# Patient Record
Sex: Male | Born: 1975 | Race: Black or African American | Hispanic: No | Marital: Single | Smoking: Former smoker
Health system: Southern US, Community
[De-identification: ages and names within clinical notes are randomized; demographics above are authoritative.]

## PROBLEM LIST (undated history)

## (undated) DIAGNOSIS — I10 Essential (primary) hypertension: Secondary | ICD-10-CM

## (undated) DIAGNOSIS — I82409 Acute embolism and thrombosis of unspecified deep veins of unspecified lower extremity: Secondary | ICD-10-CM

## (undated) DIAGNOSIS — E119 Type 2 diabetes mellitus without complications: Secondary | ICD-10-CM

## (undated) DIAGNOSIS — N200 Calculus of kidney: Secondary | ICD-10-CM

---

## 2004-02-15 ENCOUNTER — Emergency Department: Payer: Self-pay | Admitting: Emergency Medicine

## 2005-12-28 ENCOUNTER — Emergency Department (HOSPITAL_COMMUNITY): Admission: EM | Admit: 2005-12-28 | Discharge: 2005-12-28 | Payer: Self-pay | Admitting: Emergency Medicine

## 2008-08-09 ENCOUNTER — Ambulatory Visit: Payer: Self-pay | Admitting: Family Medicine

## 2009-08-28 ENCOUNTER — Emergency Department: Payer: Self-pay | Admitting: Emergency Medicine

## 2012-09-30 ENCOUNTER — Emergency Department (HOSPITAL_COMMUNITY)
Admission: EM | Admit: 2012-09-30 | Discharge: 2012-09-30 | Disposition: A | Discharge status: Home / Self Care | Payer: BC Managed Care – PPO | Source: Home / Self Care | Attending: Emergency Medicine | Admitting: Emergency Medicine

## 2012-09-30 ENCOUNTER — Encounter (HOSPITAL_COMMUNITY): Payer: Self-pay | Admitting: Emergency Medicine

## 2012-09-30 DIAGNOSIS — N200 Calculus of kidney: Secondary | ICD-10-CM

## 2012-09-30 DIAGNOSIS — R319 Hematuria, unspecified: Secondary | ICD-10-CM

## 2012-09-30 HISTORY — DX: Type 2 diabetes mellitus without complications: E11.9

## 2012-09-30 HISTORY — DX: Calculus of kidney: N20.0

## 2012-09-30 HISTORY — DX: Essential (primary) hypertension: I10

## 2012-09-30 LAB — POCT URINALYSIS DIP (DEVICE)
Bilirubin Urine: NEGATIVE
Leukocytes, UA: NEGATIVE
Nitrite: NEGATIVE
pH: 5.5 (ref 5.0–8.0)

## 2012-09-30 MED ORDER — METFORMIN HCL 1000 MG PO TABS: 500.0000 mg | ORAL_TABLET | Freq: Two times a day (BID) | ORAL | Status: DC

## 2012-09-30 MED ORDER — METFORMIN HCL 1000 MG PO TABS: 1000.0000 mg | ORAL_TABLET | Freq: Two times a day (BID) | ORAL | Status: DC

## 2012-09-30 NOTE — ED Notes (Signed)
Pt c/o 1 episode of hematuria onset 1430... Reports he had several urinary voids w/no blood but having dysuria... Denies: fevers, back/abd pain... Hx of kidney stones 2 yrs ago; does not remember how they were resolved but remembers going to [facility]... He is alert and ambulated well to exam room w/no signs of acute distress.

## 2012-09-30 NOTE — ED Provider Notes (Signed)
History    CSN: [id number] Arrival date & time 09/30/12  1858  First MD Initiated Contact with Patient 09/30/12 1906     Chief Complaint  Patient presents with  . Hematuria   (Consider location/radiation/quality/duration/timing/severity/associated sxs/prior Treatment) HPI Comments:   Patient presents to urgent carethis evening describing that earlier today he had several small episodes of urinary discomfort and minimal pelvic pain that subsided after that. He did notice a small blood clot in his urine. Patient reports that he had a kidney stone about 2 years ago that passed spontaneously and was diagnosed with a CT scan. Patient also describes that he is diabetic and has been off medicines for many months now as he lost his insurance coverage.  Currently patient denies any abdominal pain flank pain nausea vomiting or fevers   Patient is a 37 y.o. male presenting with hematuria. The history is provided by the patient.  Hematuria This is a new problem. The problem occurs constantly. Pertinent negatives include no chest pain, no headaches and no shortness of breath. Nothing aggravates the symptoms. Nothing relieves the symptoms.   Past Medical History  Diagnosis Date  . Kidney stone   . Hypertension   . Diabetes mellitus without complication    History reviewed. No pertinent past surgical history. No family history on file. History  Substance Use Topics  . Smoking status: Light Tobacco Smoker  . Smokeless tobacco: Not on file  . Alcohol Use: No    Review of Systems  Constitutional: Negative for fever, chills, diaphoresis, appetite change and fatigue.  Respiratory: Negative for shortness of breath.   Cardiovascular: Negative for chest pain.  Genitourinary: Positive for hematuria. Negative for urgency, flank pain, decreased urine volume, discharge, penile swelling, scrotal swelling, penile pain and testicular pain.  Neurological: Negative for headaches.    Allergies  Review  of patient's allergies indicates no known allergies.  Home Medications   Current Outpatient Rx  Name  Route  Sig  Dispense  Refill  . LISINOPRIL PO   Oral   Take by mouth.         . METFORMIN HCL PO   Oral   Take by mouth.         . Rosuvastatin Calcium (CRESTOR PO)   Oral   Take by mouth.         . metFORMIN (GLUCOPHAGE) 1000 MG tablet   Oral   Take 0.5 tablets (500 mg total) by mouth 2 (two) times daily with a meal.   60 tablet   1    BP 144/79  Pulse 97  Temp(Src) 98.2 F (36.8 C) (Oral)  Resp 20  SpO2 98% Physical Exam  Nursing note and vitals reviewed. Constitutional: He appears well-developed and well-nourished. No distress.  Pulmonary/Chest: Effort normal and breath sounds normal.  Abdominal: Soft. He exhibits no distension and no mass. There is no tenderness. There is no rigidity, no rebound, no guarding, no CVA tenderness, no tenderness at McBurney's point and negative Murphy's sign.  Neurological: He is alert.  Skin: No erythema.    ED Course  Procedures (including critical care time) Labs Reviewed  GLUCOSE, CAPILLARY - Abnormal; Notable for the following:    Glucose-Capillary 302 (*)    All other components within normal limits  POCT URINALYSIS DIP (DEVICE) - Abnormal; Notable for the following:    Glucose, UA 500 (*)    Ketones, ur TRACE (*)    Hgb urine dipstick TRACE (*)    All other components  within normal limits   No results found. 1. Hematuria   2. Kidney stones     MDM  Problem #1 Hematuria with resolved pain,  Patient had a episode early this afternoon of frequent urination and minimal discomfort that subsided after noticing a blood clot in his urine. Patient is currently asymptomatic afebrile with a normal abdominal exam. Suspect a result ureteral lithiasis- abdomen discuss with patient what symptoms will warrant further evaluation in the emergency department. Such as increased abdominal pain vomiting or fevers. Patient is  currently asymptomatic on the exam table.  A strainer was provided to patient   Problem #2 is compensated diabetes  Marked glucosuria was noted in urine which elicited a capillary glucose was in the 300s. Patient has been advised to resume his metformin the patient described and reported to his that he has a followup appointment with her new primary care Dr. as he had to interrupt his medical attention due to lack of insurance.  Jimmie Molly, MD 09/30/12 2029

## 2014-02-26 ENCOUNTER — Emergency Department (HOSPITAL_COMMUNITY): Payer: BC Managed Care – PPO

## 2014-02-26 ENCOUNTER — Encounter (HOSPITAL_COMMUNITY): Payer: Self-pay | Admitting: Emergency Medicine

## 2014-02-26 ENCOUNTER — Emergency Department (HOSPITAL_COMMUNITY)
Admission: EM | Admit: 2014-02-26 | Discharge: 2014-02-26 | Disposition: A | Discharge status: Home / Self Care | Payer: BC Managed Care – PPO | Attending: Emergency Medicine | Admitting: Emergency Medicine

## 2014-02-26 DIAGNOSIS — J069 Acute upper respiratory infection, unspecified: Secondary | ICD-10-CM | POA: Diagnosis not present

## 2014-02-26 DIAGNOSIS — M791 Myalgia: Secondary | ICD-10-CM | POA: Insufficient documentation

## 2014-02-26 DIAGNOSIS — I10 Essential (primary) hypertension: Secondary | ICD-10-CM | POA: Diagnosis not present

## 2014-02-26 DIAGNOSIS — Z87442 Personal history of urinary calculi: Secondary | ICD-10-CM | POA: Insufficient documentation

## 2014-02-26 DIAGNOSIS — R05 Cough: Secondary | ICD-10-CM

## 2014-02-26 DIAGNOSIS — E1165 Type 2 diabetes mellitus with hyperglycemia: Secondary | ICD-10-CM | POA: Insufficient documentation

## 2014-02-26 DIAGNOSIS — B9789 Other viral agents as the cause of diseases classified elsewhere: Secondary | ICD-10-CM

## 2014-02-26 DIAGNOSIS — R059 Cough, unspecified: Secondary | ICD-10-CM

## 2014-02-26 DIAGNOSIS — Z79899 Other long term (current) drug therapy: Secondary | ICD-10-CM | POA: Insufficient documentation

## 2014-02-26 DIAGNOSIS — R630 Anorexia: Secondary | ICD-10-CM | POA: Diagnosis not present

## 2014-02-26 DIAGNOSIS — Z880 Allergy status to penicillin: Secondary | ICD-10-CM | POA: Diagnosis not present

## 2014-02-26 DIAGNOSIS — R52 Pain, unspecified: Secondary | ICD-10-CM | POA: Diagnosis present

## 2014-02-26 DIAGNOSIS — R739 Hyperglycemia, unspecified: Secondary | ICD-10-CM

## 2014-02-26 LAB — CBG MONITORING, ED: GLUCOSE-CAPILLARY: 202 mg/dL — AB (ref 70–99)

## 2014-02-26 MED ORDER — PSEUDOEPHEDRINE HCL 30 MG PO TABS: 30.0000 mg | ORAL_TABLET | ORAL | Status: DC | PRN

## 2014-02-26 MED ORDER — METFORMIN HCL 1000 MG PO TABS: 1000.0000 mg | ORAL_TABLET | Freq: Two times a day (BID) | ORAL | Status: DC

## 2014-02-26 MED ORDER — BENZONATATE 100 MG PO CAPS: 100.0000 mg | ORAL_CAPSULE | Freq: Three times a day (TID) | ORAL | Status: DC

## 2014-02-26 NOTE — ED Notes (Signed)
Patient returned from Xray. Waiting for results.

## 2014-02-26 NOTE — ED Notes (Signed)
C/o chills and body aches x3 days. Moderate cough, non-productive. Loss of appetite but denies n/v.

## 2014-02-26 NOTE — ED Notes (Signed)
Pt c/o URI sx with body aches x 3 days; pt sts some fever

## 2014-02-26 NOTE — Discharge Instructions (Signed)
Sudafed for congestion. Tessalon for cough. Take metformin on regular basis. Watch your diet, exercise. Follow up with primary care doctor for recheck.   Upper Respiratory Infection, Adult An upper respiratory infection (URI) is also sometimes known as the common cold. The upper respiratory tract includes the nose, sinuses, throat, trachea, and bronchi. Bronchi are the airways leading to the lungs. Most people improve within 1 week, but symptoms can last up to 2 weeks. A residual cough may last even longer.  CAUSES Many different viruses can infect the tissues lining the upper respiratory tract. The tissues become irritated and inflamed and often become very moist. Mucus production is also common. A cold is contagious. You can easily spread the virus to others by oral contact. This includes kissing, sharing a glass, coughing, or sneezing. Touching your mouth or nose and then touching a surface, which is then touched by another person, can also spread the virus. SYMPTOMS  Symptoms typically develop 1 to 3 days after you come in contact with a cold virus. Symptoms vary from person to person. They may include:  Runny nose.  Sneezing.  Nasal congestion.  Sinus irritation.  Sore throat.  Loss of voice (laryngitis).  Cough.  Fatigue.  Muscle aches.  Loss of appetite.  Headache.  Low-grade fever. DIAGNOSIS  You might diagnose your own cold based on familiar symptoms, since most people get a cold 2 to 3 times a year. Your caregiver can confirm this based on your exam. Most importantly, your caregiver can check that your symptoms are not due to another disease such as strep throat, sinusitis, pneumonia, asthma, or epiglottitis. Blood tests, throat tests, and X-rays are not necessary to diagnose a common cold, but they may sometimes be helpful in excluding other more serious diseases. Your caregiver will decide if any further tests are required. RISKS AND COMPLICATIONS  You may be at risk  for a more severe case of the common cold if you smoke cigarettes, have chronic heart disease (such as heart failure) or lung disease (such as asthma), or if you have a weakened immune system. The very young and very old are also at risk for more serious infections. Bacterial sinusitis, middle ear infections, and bacterial pneumonia can complicate the common cold. The common cold can worsen asthma and chronic obstructive pulmonary disease (COPD). Sometimes, these complications can require emergency medical care and may be life-threatening. PREVENTION  The best way to protect against getting a cold is to practice good hygiene. Avoid oral or hand contact with people with cold symptoms. Wash your hands often if contact occurs. There is no clear evidence that vitamin C, vitamin E, echinacea, or exercise reduces the chance of developing a cold. However, it is always recommended to get plenty of rest and practice good nutrition. TREATMENT  Treatment is directed at relieving symptoms. There is no cure. Antibiotics are not effective, because the infection is caused by a virus, not by bacteria. Treatment may include:  Increased fluid intake. Sports drinks offer valuable electrolytes, sugars, and fluids.  Breathing heated mist or steam (vaporizer or shower).  Eating chicken soup or other clear broths, and maintaining good nutrition.  Getting plenty of rest.  Using gargles or lozenges for comfort.  Controlling fevers with ibuprofen or acetaminophen as directed by your caregiver.  Increasing usage of your inhaler if you have asthma. Zinc gel and zinc lozenges, taken in the first 24 hours of the common cold, can shorten the duration and lessen the severity of symptoms. Pain  medicines may help with fever, muscle aches, and throat pain. A variety of non-prescription medicines are available to treat congestion and runny nose. Your caregiver can make recommendations and may suggest nasal or lung inhalers for other  symptoms.  HOME CARE INSTRUCTIONS   Only take over-the-counter or prescription medicines for pain, discomfort, or fever as directed by your caregiver.  Use a warm mist humidifier or inhale steam from a shower to increase air moisture. This may keep secretions moist and make it easier to breathe.  Drink enough water and fluids to keep your urine clear or pale yellow.  Rest as needed.  Return to work when your temperature has returned to normal or as your caregiver advises. You may need to stay home longer to avoid infecting others. You can also use a face mask and careful hand washing to prevent spread of the virus. SEEK MEDICAL CARE IF:   After the first few days, you feel you are getting worse rather than better.  You need your caregiver's advice about medicines to control symptoms.  You develop chills, worsening shortness of breath, or brown or red sputum. These may be signs of pneumonia.  You develop yellow or brown nasal discharge or pain in the face, especially when you bend forward. These may be signs of sinusitis.  You develop a fever, swollen neck glands, pain with swallowing, or white areas in the back of your throat. These may be signs of strep throat. SEEK IMMEDIATE MEDICAL CARE IF:   You have a fever.  You develop severe or persistent headache, ear pain, sinus pain, or chest pain.  You develop wheezing, a prolonged cough, cough up blood, or have a change in your usual mucus (if you have chronic lung disease).  You develop sore muscles or a stiff neck. Document Released: 09/01/2000 Document Revised: 05/31/2011 Document Reviewed: 06/13/2013 [facility]ExitCare Patient Information 2015 [place], [place]. This information is not intended to replace advice given to you by your health care provider. Make sure you discuss any questions you have with your health care provider.   Hyperglycemia Hyperglycemia occurs when the glucose (sugar) in your blood is too high. Hyperglycemia can happen  for many reasons, but it most often happens to people who do not know they have diabetes or are not managing their diabetes properly.  CAUSES  Whether you have diabetes or not, there are other causes of hyperglycemia. Hyperglycemia can occur when you have diabetes, but it can also occur in other situations that you might not be as aware of, such as: Diabetes  If you have diabetes and are having problems controlling your blood glucose, hyperglycemia could occur because of some of the following reasons:  Not following your meal plan.  Not taking your diabetes medications or not taking it properly.  Exercising less or doing less activity than you normally do.  Being sick. Pre-diabetes  This cannot be ignored. Before people develop Type 2 diabetes, they almost always have "pre-diabetes." This is when your blood glucose levels are higher than normal, but not yet high enough to be diagnosed as diabetes. Research has shown that some long-term damage to the body, especially the heart and circulatory system, may already be occurring during pre-diabetes. If you take action to manage your blood glucose when you have pre-diabetes, you may delay or prevent Type 2 diabetes from developing. Stress  If you have diabetes, you may be "diet" controlled or on oral medications or insulin to control your diabetes. However, you may find that  your blood glucose is higher than usual in the hospital whether you have diabetes or not. This is often referred to as "stress hyperglycemia." Stress can elevate your blood glucose. This happens because of hormones put out by the body during times of stress. If stress has been the cause of your high blood glucose, it can be followed regularly by your caregiver. That way he/she can make sure your hyperglycemia does not continue to get worse or progress to diabetes. Steroids  Steroids are medications that act on the infection fighting system (immune system) to block inflammation or  infection. One side effect can be a rise in blood glucose. Most people can produce enough extra insulin to allow for this rise, but for those who cannot, steroids make blood glucose levels go even higher. It is not unusual for steroid treatments to "uncover" diabetes that is developing. It is not always possible to determine if the hyperglycemia will go away after the steroids are stopped. A special blood test called an A1c is sometimes done to determine if your blood glucose was elevated before the steroids were started. SYMPTOMS  Thirsty.  Frequent urination.  Dry mouth.  Blurred vision.  Tired or fatigue.  Weakness.  Sleepy.  Tingling in feet or leg. DIAGNOSIS  Diagnosis is made by monitoring blood glucose in one or all of the following ways:  A1c test. This is a chemical found in your blood.  Fingerstick blood glucose monitoring.  Laboratory results. TREATMENT  First, knowing the cause of the hyperglycemia is important before the hyperglycemia can be treated. Treatment may include, but is not be limited to:  Education.  Change or adjustment in medications.  Change or adjustment in meal plan.  Treatment for an illness, infection, etc.  More frequent blood glucose monitoring.  Change in exercise plan.  Decreasing or stopping steroids.  Lifestyle changes. HOME CARE INSTRUCTIONS   Test your blood glucose as directed.  Exercise regularly. Your caregiver will give you instructions about exercise. Pre-diabetes or diabetes which comes on with stress is helped by exercising.  Eat wholesome, balanced meals. Eat often and at regular, fixed times. Your caregiver or nutritionist will give you a meal plan to guide your sugar intake.  Being at an ideal weight is important. If needed, losing as little as 10 to 15 pounds may help improve blood glucose levels. SEEK MEDICAL CARE IF:   You have questions about medicine, activity, or diet.  You continue to have symptoms  (problems such as increased thirst, urination, or weight gain). SEEK IMMEDIATE MEDICAL CARE IF:   You are vomiting or have diarrhea.  Your breath smells fruity.  You are breathing faster or slower.  You are very sleepy or incoherent.  You have numbness, tingling, or pain in your feet or hands.  You have chest pain.  Your symptoms get worse even though you have been following your caregiver's orders.  If you have any other questions or concerns. Document Released: 09/01/2000 Document Revised: 05/31/2011 Document Reviewed: 07/05/2011 [facility] CenterExitCare Patient Information 2015 [place]ExitCare, [place]LLC. This information is not intended to replace advice given to you by your health care provider. Make sure you discuss any questions you have with your health care provider.

## 2014-02-26 NOTE — ED Provider Notes (Signed)
CSN: [id number]     Arrival date & time 02/26/14  1229 History  This chart was scribed for non-physician practitioner, Jaynie Crumbleatyana Eero Dini, PA-C working with Arby BarretteMarcy Pfeiffer, MD by Greggory StallionKayla Andersen, ED scribe. This patient was seen in room TR06C/TR06C and the patient's care was started at 1:17 PM.   Chief Complaint  Patient presents with  . URI  . Generalized Body Aches   The history is provided by the patient. No language interpreter was used.    HPI Comments: Noah Clayton is a 38 y.o. male who presents to the Emergency Department complaining of nonproductive cough, chills and generalized body aches that started one week ago. Also reports decreased appetite and subjective fever. Pt has taken coricidin flu. Symptoms went away for 2 days then returned. Denies sore throat. Pt states he can't afford the strips to check his sugar so he hasn't recently. They were running around 140 the last time he checked them. He hasn't taken his metformin since earlier this year. Pt does not have a PCP.   Past Medical History  Diagnosis Date  . Kidney stone   . Hypertension   . Diabetes mellitus without complication    History reviewed. No pertinent past surgical history. History reviewed. No pertinent family history. History  Substance Use Topics  . Smoking status: Light Tobacco Smoker  . Smokeless tobacco: Not on file  . Alcohol Use: No    Review of Systems  Constitutional: Positive for fever, chills and appetite change.  HENT: Negative for sore throat.   Respiratory: Positive for cough.   Musculoskeletal: Positive for myalgias.  All other systems reviewed and are negative.  Allergies  Amoxicillin  Home Medications   Prior to Admission medications   Medication Sig Start Date End Date Taking? Authorizing Provider  Benazepril HCl (LOTENSIN PO) Take 2 tablets by mouth at bedtime.   Yes Historical Provider, MD  Chlorphen-Pseudoephed-APAP (CORICIDIN D PO) Take by mouth as needed (flu).   Yes  Historical Provider, MD  metFORMIN (GLUCOPHAGE) 1000 MG tablet Take 0.5 tablets (500 mg total) by mouth 2 (two) times daily with a meal. 09/30/12  Yes Jimmie MollyPaolo Coll, MD  Rosuvastatin Calcium (CRESTOR PO) Take 1 tablet by mouth daily.    Yes Historical Provider, MD   BP 140/84 mmHg  Pulse 91  Temp(Src) 98 F (36.7 C) (Oral)  Resp 20  Ht 5\' 11"  (1.803 m)  Wt 280 lb (127.007 kg)  BMI 39.07 kg/m2  SpO2 95%   Physical Exam  Constitutional: He is oriented to person, place, and time. He appears well-developed and well-nourished. No distress.  HENT:  Head: Normocephalic and atraumatic.  Right Ear: External ear and ear canal normal.  Left Ear: Tympanic membrane, external ear and ear canal normal.  Nose: Rhinorrhea present.  Mouth/Throat: Uvula is midline and mucous membranes are normal. Posterior oropharyngeal erythema present. No oropharyngeal exudate or tonsillar abscesses.  Eyes: Conjunctivae and EOM are normal.  Neck: Normal range of motion. Neck supple. No tracheal deviation present.  Cardiovascular: Normal rate, regular rhythm and normal heart sounds.   No murmur heard. Pulmonary/Chest: Effort normal. No respiratory distress. He has no wheezes. He has no rales. He exhibits no tenderness.  Musculoskeletal: Normal range of motion.  Neurological: He is alert and oriented to person, place, and time.  Skin: Skin is warm and dry.  Psychiatric: He has a normal mood and affect. His behavior is normal.  Nursing note and vitals reviewed.   ED Course  Procedures (including critical  care time)  DIAGNOSTIC STUDIES: Oxygen Saturation is 95% on RA, adequate by my interpretation.    COORDINATION OF CARE: 1:21 PM-Discussed treatment plan which includes chest xray and blood glucose check with pt at bedside and pt agreed to plan.   Labs Review Labs Reviewed  CBG MONITORING, ED - Abnormal; Notable for the following:    Glucose-Capillary 202 (*)    All other components within normal limits     Imaging Review Dg Chest 2 View  02/26/2014   CLINICAL DATA:  Cough.  EXAM: CHEST  2 VIEW  COMPARISON:  December 28, 2005.  FINDINGS: The heart size and mediastinal contours are within normal limits. Both lungs are clear. No pneumothorax or pleural effusion is noted. The visualized skeletal structures are unremarkable.  IMPRESSION: No acute cardiopulmonary abnormality seen.   Electronically Signed   By: Roque LiasJames  Green M.D.   On: 02/26/2014 14:00     EKG Interpretation None      MDM   Final diagnoses:  Hyperglycemia  Viral URI with cough    Ration is here with URI symptoms. Also unsure what his blood sugar is. He is afebrile, nontoxic appearing, normal vital signs. Blood sugars 202. We'll restart him back on his metformin, chest x-ray obtained and is negative. I suspect patient has a viral URI versus influenza. Treatment symptomatic. Discharge home with outpatient follow-up. Return precautions discussed  Filed Vitals:   02/26/14 1240 02/26/14 1434  BP: 140/84 135/80  Pulse: 91 77  Temp: 98 F (36.7 C) 97.9 F (36.6 C)  TempSrc: Oral Oral  Resp: 20 20  Height: 5\' 11"  (1.803 m)   Weight: 280 lb (127.007 kg)   SpO2: 95% 96%     I personally performed the services described in this documentation, which was scribed in my presence. The recorded information has been reviewed and is accurate.  Lottie Musselatyana A Krisi Azua, PA-C 02/26/14 1518  Arby BarretteMarcy Pfeiffer, MD 02/26/14 [phone]

## 2014-07-06 ENCOUNTER — Emergency Department (HOSPITAL_COMMUNITY)
Admission: EM | Admit: 2014-07-06 | Discharge: 2014-07-06 | Disposition: A | Discharge status: Home / Self Care | Payer: BLUE CROSS/BLUE SHIELD | Attending: Emergency Medicine | Admitting: Emergency Medicine

## 2014-07-06 ENCOUNTER — Encounter (HOSPITAL_COMMUNITY): Payer: Self-pay | Admitting: Emergency Medicine

## 2014-07-06 DIAGNOSIS — Z880 Allergy status to penicillin: Secondary | ICD-10-CM | POA: Insufficient documentation

## 2014-07-06 DIAGNOSIS — Z720 Tobacco use: Secondary | ICD-10-CM | POA: Diagnosis not present

## 2014-07-06 DIAGNOSIS — Z79899 Other long term (current) drug therapy: Secondary | ICD-10-CM | POA: Diagnosis not present

## 2014-07-06 DIAGNOSIS — I82401 Acute embolism and thrombosis of unspecified deep veins of right lower extremity: Secondary | ICD-10-CM

## 2014-07-06 DIAGNOSIS — I10 Essential (primary) hypertension: Secondary | ICD-10-CM | POA: Diagnosis not present

## 2014-07-06 DIAGNOSIS — M79604 Pain in right leg: Secondary | ICD-10-CM | POA: Diagnosis present

## 2014-07-06 DIAGNOSIS — E119 Type 2 diabetes mellitus without complications: Secondary | ICD-10-CM | POA: Diagnosis not present

## 2014-07-06 DIAGNOSIS — Z87442 Personal history of urinary calculi: Secondary | ICD-10-CM | POA: Diagnosis not present

## 2014-07-06 DIAGNOSIS — M79609 Pain in unspecified limb: Secondary | ICD-10-CM | POA: Diagnosis not present

## 2014-07-06 LAB — BASIC METABOLIC PANEL
Anion gap: 10 (ref 5–15)
BUN: 15 mg/dL (ref 6–23)
CALCIUM: 9.2 mg/dL (ref 8.4–10.5)
CO2: 24 mmol/L (ref 19–32)
Chloride: 102 mmol/L (ref 96–112)
Creatinine, Ser: 0.98 mg/dL (ref 0.50–1.35)
GFR calc Af Amer: >90 mL/min (ref >90)
Glucose, Bld: 296 mg/dL — ABNORMAL HIGH (ref 70–99)
Potassium: 4.1 mmol/L (ref 3.5–5.1)
Sodium: 136 mmol/L (ref 135–145)

## 2014-07-06 LAB — CBC WITH DIFFERENTIAL/PLATELET
BASOS ABS: 0 10*3/uL (ref 0.0–0.1)
BASOS PCT: 0 % (ref 0–1)
EOS ABS: 0.1 10*3/uL (ref 0.0–0.7)
Eosinophils Relative: 1 % (ref 0–5)
HCT: 47.8 % (ref 39.0–52.0)
HEMOGLOBIN: 16.1 g/dL (ref 13.0–17.0)
LYMPHS ABS: 3.8 10*3/uL (ref 0.7–4.0)
LYMPHS PCT: 35 % (ref 12–46)
MCH: 29.4 pg (ref 26.0–34.0)
MCHC: 33.7 g/dL (ref 30.0–36.0)
MCV: 87.2 fL (ref 78.0–100.0)
Monocytes Absolute: 0.8 10*3/uL (ref 0.1–1.0)
Monocytes Relative: 8 % (ref 3–12)
Neutro Abs: 6.1 10*3/uL (ref 1.7–7.7)
Neutrophils Relative %: 56 % (ref 43–77)
PLATELETS: 231 10*3/uL (ref 150–400)
RBC: 5.48 MIL/uL (ref 4.22–5.81)
RDW: 13.7 % (ref 11.5–15.5)
WBC: 10.8 10*3/uL — AB (ref 4.0–10.5)

## 2014-07-06 MED ORDER — RIVAROXABAN 15 MG PO TABS
15.0000 mg | ORAL_TABLET | Freq: Once | ORAL | Status: AC
  Administered 2014-07-06: 15 mg via ORAL
  Filled 2014-07-06: qty 1

## 2014-07-06 MED ORDER — XARELTO VTE STARTER PACK 15 & 20 MG PO TBPK: 15.0000 mg | ORAL_TABLET | ORAL | Status: DC

## 2014-07-06 MED ORDER — HYDROCODONE-ACETAMINOPHEN 5-325 MG PO TABS
1.0000 | ORAL_TABLET | Freq: Once | ORAL | Status: AC
  Administered 2014-07-06: 1 via ORAL
  Filled 2014-07-06: qty 1

## 2014-07-06 NOTE — Discharge Instructions (Signed)
You have a blood clot in your right lower leg.  Take blood thinner medication as prescribed and follow up closely with your doctor for further management.  Return if you develop chest pain, trouble breathing, or coughing up blood.  Deep Vein Thrombosis A deep vein thrombosis (DVT) is a blood clot that develops in the deep, larger veins of the leg, arm, or pelvis. These are more dangerous than clots that might form in veins near the surface of the body. A DVT can lead to serious and even life-threatening complications if the clot breaks off and travels in the bloodstream to the lungs.  A DVT can damage the valves in your leg veins so that instead of flowing upward, the blood pools in the lower leg. This is called post-thrombotic syndrome, and it can result in pain, swelling, discoloration, and sores on the leg. CAUSES Usually, several things contribute to the formation of blood clots. Contributing factors include:  The flow of blood slows down.  The inside of the vein is damaged in some way.  You have a condition that makes blood clot more easily. RISK FACTORS Some people are more likely than others to develop blood clots. Risk factors include:   Smoking.  Being overweight (obese).  Sitting or lying still for a long time. This includes long-distance travel, paralysis, or recovery from an illness or surgery. Other factors that increase risk are:   Older age, especially over 13 years of age.  Having a family history of blood clots or if you have already had a blot clot.  Having major or lengthy surgery. This is especially true for surgery on the hip, knee, or belly (abdomen). Hip surgery is particularly high risk.  Having a long, thin tube (catheter) placed inside a vein during a medical procedure.  Breaking a hip or leg.  Having cancer or cancer treatment.  Pregnancy and childbirth.  Hormone changes make the blood clot more easily during pregnancy.  The fetus puts pressure on the  veins of the pelvis.  There is a risk of injury to veins during delivery or a caesarean delivery. The risk is highest just after childbirth.  Medicines containing the male hormone estrogen. This includes birth control pills and hormone replacement therapy.  Other circulation or heart problems.  SIGNS AND SYMPTOMS When a clot forms, it can either partially or totally block the blood flow in that vein. Symptoms of a DVT can include:  Swelling of the leg or arm, especially if one side is much worse.  Warmth and redness of the leg or arm, especially if one side is much worse.  Pain in an arm or leg. If the clot is in the leg, symptoms may be more noticeable or worse when standing or walking. The symptoms of a DVT that has traveled to the lungs (pulmonary embolism, PE) usually start suddenly and include:  Shortness of breath.  Coughing.  Coughing up blood or blood-tinged mucus.  Chest pain. The chest pain is often worse with deep breaths.  Rapid heartbeat. Anyone with these symptoms should get emergency medical treatment right away. Do not wait to see if the symptoms will go away. Call your local emergency services (911 in the [place] if you have these symptoms. Do not drive yourself to the hospital. DIAGNOSIS If a DVT is suspected, your health care provider will take a full medical history and perform a physical exam. Tests that also may be required include:  Blood tests, including studies of the clotting properties  of the blood.  Ultrasound to see if you have clots in your legs or lungs.  X-rays to show the flow of blood when dye is injected into the veins (venogram).  Studies of your lungs if you have any chest symptoms. PREVENTION  Exercise the legs regularly. Take a brisk 30-minute walk every day.  Maintain a weight that is appropriate for your height.  Avoid sitting or lying in bed for long periods of time without moving your legs.  Women, particularly those over the  age of 35 years, should consider the risks and benefits of taking estrogen medicines, including birth control pills.  Do not smoke, especially if you take estrogen medicines.  Long-distance travel can increase your risk of DVT. You should exercise your legs by walking or pumping the muscles every hour.  Many of the risk factors above relate to situations that exist with hospitalization, either for illness, injury, or elective surgery. Prevention may include medical and nonmedical measures.  Your health care provider will assess you for the need for venous thromboembolism prevention when you are admitted to the hospital. If you are having surgery, your surgeon will assess you the day of or day after surgery. TREATMENT Once identified, a DVT can be treated. It can also be prevented in some circumstances. Once you have had a DVT, you may be at increased risk for a DVT in the future. The most common treatment for DVT is blood-thinning (anticoagulant) medicine, which reduces the blood's tendency to clot. Anticoagulants can stop new blood clots from forming and stop old clots from growing. They cannot dissolve existing clots. Your body does this by itself over time. Anticoagulants can be given by mouth, through an IV tube, or by injection. Your health care provider will determine the best program for you. Other medicines or treatments that may be used are:  Heparin or related medicines (low molecular weight heparin) are often the first treatment for a blood clot. They act quickly. However, they cannot be taken orally and must be given either in shot form or by IV tube.  Heparin can cause a fall in a component of blood that stops bleeding and forms blood clots (platelets). You will be monitored with blood tests to be sure this does not occur.  Warfarin is an anticoagulant that can be swallowed. It takes a few days to start working, so usually heparin or related medicines are used in combination. Once  warfarin is working, heparin is usually stopped.  Factor Xa inhibitor medicines, such as rivaroxaban and apixaban, also reduce blood clotting. These medicines are taken orally and can often be used without heparin or related medicines.  Less commonly, clot dissolving drugs (thrombolytics) are used to dissolve a DVT. They carry a high risk of bleeding, so they are used mainly in severe cases where your life or a part of your body is threatened.  Very rarely, a blood clot in the leg needs to be removed surgically.  If you are unable to take anticoagulants, your health care provider may arrange for you to have a filter placed in a main vein in your abdomen. This filter prevents clots from traveling to your lungs. HOME CARE INSTRUCTIONS  Take all medicines as directed by your health care provider.  Learn as much as you can about DVT.  Wear a medical alert bracelet or carry a medical alert card.  Ask your health care provider how soon you can go back to normal activities. It is important to stay active  to prevent blood clots. If you are on anticoagulant medicine, avoid contact sports.  It is very important to exercise. This is especially important while traveling, sitting, or standing for long periods of time. Exercise your legs by walking or by tightening and relaxing your leg muscles regularly. Take frequent walks.  You may need to wear compression stockings. These are tight elastic stockings that apply pressure to the lower legs. This pressure can help keep the blood in the legs from clotting. Taking Warfarin Warfarin is a daily medicine that is taken by mouth. Your health care provider will advise you on the length of treatment (usually 3-6 months, sometimes lifelong). If you take warfarin:  Understand how to take warfarin and foods that can affect how warfarin works in Public relations account executive.  Too much and too little warfarin are both dangerous. Too much warfarin increases the risk of bleeding. Too  little warfarin continues to allow the risk for blood clots. Warfarin and Regular Blood Testing While taking warfarin, you will need to have regular blood tests to measure your blood clotting time. These blood tests usually include both the prothrombin time (PT) and international normalized ratio (INR) tests. The PT and INR results allow your health care provider to adjust your dose of warfarin. It is very important that you have your PT and INR tested as often as directed by your health care provider.  Warfarin and Your Diet Avoid major changes in your diet, or notify your health care provider before changing your diet. Arrange a visit with a registered dietitian to answer your questions. Many foods, especially foods high in vitamin K, can interfere with warfarin and affect the PT and INR results. You should eat a consistent amount of foods high in vitamin K. Foods high in vitamin K include:   Spinach, kale, broccoli, cabbage, collard and turnip greens, Brussels sprouts, peas, cauliflower, seaweed, and parsley.  Beef and pork liver.  Green tea.  Soybean oil. Warfarin with Other Medicines Many medicines can interfere with warfarin and affect the PT and INR results. You must:  Tell your health care provider about any and all medicines, vitamins, and supplements you take, including aspirin and other over-the-counter anti-inflammatory medicines. Be especially cautious with aspirin and anti-inflammatory medicines. Ask your health care provider before taking these.  Do not take or discontinue any prescribed or over-the-counter medicine except on the advice of your health care provider or pharmacist. Warfarin Side Effects Warfarin can have side effects, such as easy bruising and difficulty stopping bleeding. Ask your health care provider or pharmacist about other side effects of warfarin. You will need to:  Hold pressure over cuts for longer than usual.  Notify your dentist and other health care  providers that you are taking warfarin before you undergo any procedures where bleeding may occur. Warfarin with Alcohol and Tobacco   Drinking alcohol frequently can increase the effect of warfarin, leading to excess bleeding. It is best to avoid alcoholic drinks or to consume only very small amounts while taking warfarin. Notify your health care provider if you change your alcohol intake.   Do not use any tobacco products including cigarettes, chewing tobacco, or electronic cigarettes. If you smoke, quit. Ask your health care provider for help with quitting smoking. Alternative Medicines to Warfarin: Factor Xa Inhibitor Medicines  These blood-thinning medicines are taken by mouth, usually for several weeks or longer. It is important to take the medicine every single day at the same time each day.  There are no  regular blood tests required when using these medicines.  There are fewer food and drug interactions than with warfarin.  The side effects of this class of medicine are similar to those of warfarin, including excessive bruising or bleeding. Ask your health care provider or pharmacist about other potential side effects. SEEK MEDICAL CARE IF:  You notice a rapid heartbeat.  You feel weaker or more tired than usual.  You feel faint.  You notice increased bruising.  You feel your symptoms are not getting better in the time expected.  You believe you are having side effects of medicine. SEEK IMMEDIATE MEDICAL CARE IF:  You have chest pain.  You have trouble breathing.  You have new or increased swelling or pain in one leg.  You cough up blood.  You notice blood in vomit, in a bowel movement, or in urine. MAKE SURE YOU:  Understand these instructions.  Will watch your condition.  Will get help right away if you are not doing well or get worse. Document Released: 03/08/2005 Document Revised: 07/23/2013 Document Reviewed: 11/13/2012 [facility] Patient Information 2015  [employer] [place]. This information is not intended to replace advice given to you by your health care provider. Make sure you discuss any questions you have with your health care provider.

## 2014-07-06 NOTE — ED Provider Notes (Signed)
CSN: [id number]     Arrival date & time 07/06/14  1631 History   First MD Initiated Contact with Patient 07/06/14 1649     Chief Complaint  Patient presents with  . Leg Pain     (Consider location/radiation/quality/duration/timing/severity/associated sxs/prior Treatment) HPI   39 year old male with history of non-insulin-dependent diabetes, hypertension, kidney stones presents from home complaining of right lower leg pain. Patient reports this morning he woke up noticing sharp achy pain throughout his right lower leg. Pain is constant, worsening with movement, palpation, or bending his knee. Pain not improve after taking 800 milligrams of ibuprofen. He has used a cane to walk. He denies any similar pain. He denies any previous injury or surgeries. Denies any fever, chills, chest pain, shortness of breath, productive cough, hemoptysis, low back pain, numbness or weakness. No prior history of PE or DVT, no recent surgery, prolonged bed rest, or active cancer. No recent strenuous activities. 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 Kidney stone   . Hypertension   . Diabetes mellitus without complication    History reviewed. No pertinent past surgical history. No family history on file. History  Substance Use Topics  . Smoking status: Light Tobacco Smoker  . Smokeless tobacco: Not on file  . Alcohol Use: No    Review of Systems  Constitutional: Negative for fever.  Skin: Negative for rash.  Neurological: Negative for numbness.  All other systems reviewed and are negative.     Allergies  Amoxicillin  Home Medications   Prior to Admission medications   Medication Sig Start Date End Date Taking? Authorizing Provider  Benazepril HCl (LOTENSIN PO) Take 2 tablets by mouth at bedtime.    Historical Provider, MD  benzonatate (TESSALON) 100 MG capsule Take 1 capsule (100 mg total) by mouth every 8  (eight) hours. 02/26/14   Tatyana Kirichenko, PA-C  Chlorphen-Pseudoephed-APAP (CORICIDIN D PO) Take by mouth as needed (flu).    Historical Provider, MD  metFORMIN (GLUCOPHAGE) 1000 MG tablet Take 0.5 tablets (500 mg total) by mouth 2 (two) times daily with a meal. 09/30/12   Jimmie MollyPaolo Coll, MD  metFORMIN (GLUCOPHAGE) 1000 MG tablet Take 1 tablet (1,000 mg total) by mouth 2 (two) times daily. 02/26/14   Tatyana Kirichenko, PA-C  pseudoephedrine (SUDAFED) 30 MG tablet Take 1 tablet (30 mg total) by mouth every 4 (four) hours as needed for congestion. 02/26/14   Tatyana Kirichenko, PA-C  Rosuvastatin Calcium (CRESTOR PO) Take 1 tablet by mouth daily.     Historical Provider, MD   BP 126/95 mmHg  Pulse 76  Temp(Src) 97.9 F (36.6 C) (Oral)  Resp 20  SpO2 96% Physical Exam  Constitutional: He appears well-developed and well-nourished. No distress.  HENT:  Head: Atraumatic.  Eyes: Conjunctivae are normal.  Neck: Normal range of motion. Neck supple.  Cardiovascular: Normal rate and regular rhythm.   Pulmonary/Chest: Effort normal and breath sounds normal.  Abdominal: Soft. There is no tenderness.  Musculoskeletal: He exhibits tenderness (Right lower extremities: Tenderness to posterior calf, and also tenderness to right anterior knee on palpation without focal point tenderness of gross deformity. Normal knee flexion and extension. No pitting edema. Intact distal pulses.).  No tenderness to midline spine on palpation  Neurological: He is alert.  Skin: No rash noted.  Psychiatric: He has a normal mood and affect.  Nursing note and vitals reviewed.   ED  Course  Procedures (including critical care time)  Patient here with unexplained right lower extremities pain and calf pain. He does not have any significant risk factor for DVT but given the location of his discomfort, or a vascular ultrasound study has been ordered to R/o DVT. He does not have evidence of skin infection, or septic joint on exam. He  is neurovascularly intact.  6:58 PM Vascular doppler study showing a peroneal vein DVT which explains pt's sxs.  Will check renal function and will start Xarelto.  8:55 PM Normal renal function.  Xarelto started.  Pt made aware CBG is elevated and need to have it recheck by PCP.  Return precaution discussed.  Care discussed with Dr. Fredderick Phenix    Progress Notes by Kern Alberta, RVS at 07/06/2014 6:11 PM    Author: Kern Alberta, RVS Service: Vascular Lab Author Type: Cardiovascular Sonographer   Filed: 07/06/2014 6:22 PM Note Time: 07/06/2014 6:11 PM Status: Signed   Editor: Kern Alberta, RVS (Cardiovascular Sonographer)     Expand All Collapse All   VASCULAR LAB PRELIMINARY PRELIMINARY PRELIMINARY PRELIMINARY  Right lower extremity venous Doppler completed.   Preliminary report: There is acute DVT noted in the right peroneal vein. All other veins appear thrombus free.   KANADY, CANDACE, RVT 07/06/2014, 6:11 PM      Labs Review Labs Reviewed  CBC WITH DIFFERENTIAL/PLATELET - Abnormal; Notable for the following:    WBC 10.8 (*)    All other components within normal limits  BASIC METABOLIC PANEL - Abnormal; Notable for the following:    Glucose, Bld 296 (*)    All other components within normal limits    Imaging Review No results found.   EKG Interpretation None      MDM   Final diagnoses:  DVT of leg (deep venous thrombosis), right    BP 140/77 mmHg  Pulse 65  Temp(Src) 97.9 F (36.6 C) (Oral)  Resp 14  SpO2 97%  I have reviewed nursing notes and vital signs. I personally reviewed the imaging tests through PACS system  I reviewed available ER/hospitalization records thought the EMR     Fayrene Helper, PA-C 07/06/14 2056  Rolan Bucco, MD 07/07/14 [phone]

## 2014-07-06 NOTE — ED Notes (Signed)
Pt from home reports sudden onset pain behind R knee and to R calf. Pt denies injury, previous surgeries. No redness or warmth noted to area and pt denies SOB.  Pt is having to ambulate with cane. Pt is A&O and in NAD

## 2014-07-06 NOTE — Progress Notes (Signed)
VASCULAR LAB PRELIMINARY  PRELIMINARY  PRELIMINARY  PRELIMINARY  Right lower extremity venous Doppler completed.    Preliminary report:  There is acute DVT noted in the right peroneal vein.  All other veins appear thrombus free.   Aiden Helzer, RVT 07/06/2014, 6:11 PM

## 2014-07-08 NOTE — Hospital Discharge Follow-Up (Signed)
PHARMACY NOTE:  TELEPHONE CALL FOR XARELTO TEACHING  Contacted patient by phone to review Xarelto teaching.   Reviewed purpose, proper use, and potential adverse effects of the medication, including bleeding.  Instructed patient to avoid aspirin and anti-inflammatory medications while on Xarelto unless instructed otherwise by his physician.  Patient confirms that his prescription for the Xarelto starter pack has been filled and he is taking one 15 mg tablet twice a day as directed on package.  Reminded patient that the dosage changes to 20 mg once daily after three weeks, and that he needs to make an appointment with primary care physician well before the starter pack runs out, so that his anticoagulant therapy is not interrupted.   Patient asked about duration of therapy - explained this will be determined by his physician at follow-up appointment.  All other questions about Xarelto were answered.  Elie Goodyandy Dolan Xia, PharmD, BCPS Pager: [phone] 07/08/2014  12:05 PM

## 2016-03-24 ENCOUNTER — Ambulatory Visit (HOSPITAL_COMMUNITY)
Admission: EM | Admit: 2016-03-24 | Discharge: 2016-03-24 | Disposition: A | Discharge status: Home / Self Care | Payer: BLUE CROSS/BLUE SHIELD | Attending: Emergency Medicine | Admitting: Emergency Medicine

## 2016-03-24 ENCOUNTER — Ambulatory Visit (INDEPENDENT_AMBULATORY_CARE_PROVIDER_SITE_OTHER): Payer: BLUE CROSS/BLUE SHIELD

## 2016-03-24 ENCOUNTER — Encounter (HOSPITAL_COMMUNITY): Payer: Self-pay | Admitting: Emergency Medicine

## 2016-03-24 DIAGNOSIS — J40 Bronchitis, not specified as acute or chronic: Secondary | ICD-10-CM

## 2016-03-24 MED ORDER — IPRATROPIUM-ALBUTEROL 0.5-2.5 (3) MG/3ML IN SOLN
3.0000 mL | Freq: Once | RESPIRATORY_TRACT | Status: AC
  Administered 2016-03-24: 3 mL via RESPIRATORY_TRACT

## 2016-03-24 MED ORDER — PREDNISONE 50 MG PO TABS: ORAL_TABLET | ORAL | 0 refills | Status: DC

## 2016-03-24 MED ORDER — IPRATROPIUM-ALBUTEROL 0.5-2.5 (3) MG/3ML IN SOLN
RESPIRATORY_TRACT | Status: AC
  Filled 2016-03-24: qty 3

## 2016-03-24 MED ORDER — ALBUTEROL SULFATE HFA 108 (90 BASE) MCG/ACT IN AERS: 2.0000 | INHALATION_SPRAY | RESPIRATORY_TRACT | 0 refills | Status: DC | PRN

## 2016-03-24 NOTE — ED Provider Notes (Signed)
[facility]    CSN: [id number] Arrival date & time: 03/24/16  1705     History   Chief Complaint Chief Complaint  Patient presents with  . Cough    HPI Noah Clayton is a 41 y.o. male.   HPI  He is a 41 year old man here for evaluation of cough. Symptoms started 3 days ago with coughing, sneezing, nasal congestion, and runny nose. Today, he started feeling feverish and achy. No documented temperature. He has heard some faint wheezing, primarily with cough. He has also felt intermittently short of breath. He also reports some soreness around the rib cage with coughing. Appetite is normal. No nausea or vomiting.  Past Medical History:  Diagnosis Date  . Diabetes mellitus without complication (HCC)   . Hypertension   . Kidney stone     There are no active problems to display for this patient.   History reviewed. No pertinent surgical history.     Home Medications    Prior to Admission medications   Medication Sig Start Date End Date Taking? Authorizing Provider  albuterol (PROVENTIL HFA;VENTOLIN HFA) 108 (90 Base) MCG/ACT inhaler Inhale 2 puffs into the lungs every 4 (four) hours as needed for wheezing or shortness of breath. 03/24/16   Charm RingsErin J Honig, MD  predniSONE (DELTASONE) 50 MG tablet Take 1 pill daily for 5 days. 03/24/16   Charm RingsErin J Honig, MD    Family History History reviewed. No pertinent family history.  Social History Social History  Substance Use Topics  . Smoking status: Light Tobacco Smoker  . Smokeless tobacco: Not on file  . Alcohol use No     Allergies   Patient has no active allergies.   Review of Systems Review of Systems As in history of present illness  Physical Exam Triage Vital Signs ED Triage Vitals  Enc Vitals Group     BP 03/24/16 1727 151/89     Pulse Rate 03/24/16 1727 92     Resp 03/24/16 1727 18     Temp 03/24/16 1727 98.8 F (37.1 C)     Temp Source 03/24/16 1727 Oral     SpO2 03/24/16 1727 97 %     Weight  --      Height --      Head Circumference --      Peak Flow --      Pain Score 03/24/16 1731 0     Pain Loc --      Pain Edu? --      Excl. in GC? --    No data found.   Updated Vital Signs BP 151/89 (BP Location: Left Arm)   Pulse 92   Temp 98.8 F (37.1 C) (Oral)   Resp 18   SpO2 97%   Visual Acuity Right Eye Distance:   Left Eye Distance:   Bilateral Distance:    Right Eye Near:   Left Eye Near:    Bilateral Near:     Physical Exam  Constitutional: He appears well-developed and well-nourished. No distress.  HENT:  Mouth/Throat: Oropharynx is clear and moist. No oropharyngeal exudate.  Nasal mucosa slightly erythematous with discharge.  Neck: Neck supple.  Cardiovascular: Normal rate, regular rhythm and normal heart sounds.   No murmur heard. Pulmonary/Chest: Effort normal. No respiratory distress. He has no wheezes. He has no rales.  Poor to fair air movement  Lymphadenopathy:    He has no cervical adenopathy.     UC Treatments / Results  Labs (all  labs ordered are listed, but only abnormal results are displayed) Labs Reviewed - No data to display  EKG  EKG Interpretation None       Radiology Dg Chest 2 View  Result Date: 03/24/2016 CLINICAL DATA:  Sick for 3 days. Cough and sneezing. Chest and head congestion. Low-grade fever. EXAM: CHEST  2 VIEW COMPARISON:  Two-view chest x-ray 02/26/2014 FINDINGS: The heart size and mediastinal contours are within normal limits. Both lungs are clear. The visualized skeletal structures are unremarkable. IMPRESSION: Negative two view chest x-ray Electronically Signed   By: Marin Roberts M.D.   On: 03/24/2016 19:10    Procedures Procedures (including critical care time)  Medications Ordered in UC Medications  ipratropium-albuterol (DUONEB) 0.5-2.5 (3) MG/3ML nebulizer solution 3 mL (3 mLs Nebulization Given 03/24/16 1909)     Initial Impression / Assessment and Plan / UC Course  I have reviewed the  triage vital signs and the nursing notes.  Pertinent labs & imaging results that were available during my care of the patient were reviewed by me and considered in my medical decision making (see chart for details).  Clinical Course    X-ray negative. Air movement much improved after DuoNeb. We'll treat for early bronchitis with prednisone and albuterol inhaler. Follow-up as needed.  Final Clinical Impressions(s) / UC Diagnoses   Final diagnoses:  Bronchitis    New Prescriptions New Prescriptions   ALBUTEROL (PROVENTIL HFA;VENTOLIN HFA) 108 (90 BASE) MCG/ACT INHALER    Inhale 2 puffs into the lungs every 4 (four) hours as needed for wheezing or shortness of breath.   PREDNISONE (DELTASONE) 50 MG TABLET    Take 1 pill daily for 5 days.     Charm Rings, MD 03/24/16 [phone]

## 2016-03-24 NOTE — Discharge Instructions (Signed)
Your x-ray is normal. We are going to treat you for very early bronchitis with prednisone. Use the albuterol inhaler every 4 hours as needed for wheezing or cough. Things should improve over the next 2-3 days. Follow-up as needed.

## 2016-03-24 NOTE — ED Triage Notes (Signed)
The patient presented to the [facility] with a complaint of a cough and sneezing x 4 days and aches and chills that started today.

## 2017-06-09 ENCOUNTER — Ambulatory Visit (HOSPITAL_COMMUNITY)
Admission: EM | Admit: 2017-06-09 | Discharge: 2017-06-09 | Disposition: A | Discharge status: Home / Self Care | Payer: Managed Care, Other (non HMO) | Attending: Family Medicine | Admitting: Family Medicine

## 2017-06-09 ENCOUNTER — Encounter (HOSPITAL_COMMUNITY): Payer: Self-pay | Admitting: Family Medicine

## 2017-06-09 DIAGNOSIS — G5712 Meralgia paresthetica, left lower limb: Secondary | ICD-10-CM | POA: Diagnosis not present

## 2017-06-09 DIAGNOSIS — E119 Type 2 diabetes mellitus without complications: Secondary | ICD-10-CM

## 2017-06-09 MED ORDER — METFORMIN HCL 500 MG PO TABS: 500.0000 mg | ORAL_TABLET | Freq: Two times a day (BID) | ORAL | 1 refills | Status: DC

## 2017-06-09 MED ORDER — DICLOFENAC SODIUM 75 MG PO TBEC: 75.0000 mg | DELAYED_RELEASE_TABLET | Freq: Two times a day (BID) | ORAL | 0 refills | Status: DC

## 2017-06-09 NOTE — ED Triage Notes (Signed)
Triaged by provider  

## 2017-06-14 NOTE — ED Provider Notes (Signed)
[facility]   [id number] 06/09/17 Arrival Time: 1946  ASSESSMENT & PLAN:  1. Meralgia paraesthetica, left   2. Type 2 diabetes mellitus without complication, without long-term current use of insulin (HCC)     Meds ordered this encounter  Medications  . diclofenac (VOLTAREN) 75 MG EC tablet    Sig: Take 1 tablet (75 mg total) by mouth 2 (two) times daily.    Dispense:  20 tablet    Refill:  0  . metFORMIN (GLUCOPHAGE) 500 MG tablet    Sig: Take 1 tablet (500 mg total) by mouth 2 (two) times daily with a meal.    Dispense:  60 tablet    Refill:  1   Will f/u here if not seeing improvement over the next week or two. Reviewed expectations re: course of current medical issues. Questions answered. Outlined signs and symptoms indicating need for more acute intervention. Patient verbalized understanding. After Visit Summary given.  SUBJECTIVE: History from: patient. Noah Clayton is a 42 y.o. male who reports intermittent upper lateral left thigh discomfort with occasional "tingling" sensation. Present on/off for a few months. No specific aggravating or alleviating factors reported. Happens randomly. No OTC treatment. Does not limit him. No extremity sensation changes or weakness. No injury. Normal bowel/bladder habits. No back pain.  Also has DM and requests refill of Metformin. Looking to est care with a PCP.  ROS: As per HPI.   OBJECTIVE:  Vitals:   06/09/17 2047 06/09/17 2050  BP: 110/73 127/85  Pulse: 86 76  Resp: 18 18  Temp: 98 F (36.7 C) 98 F (36.7 C)  SpO2: 100% 97%    General appearance: alert; no distress Extremities: no cyanosis or edema; symmetrical with no gross deformities; LLE with normal ROM; no tenderness over musculature; no swelling CV: normal extremity capillary refill Skin: warm and dry Neurologic: normal gait; normal symmetric reflexes in all extremities; normal sensation in all extremities Psychological: alert and cooperative;  normal mood and affect  No Known Allergies  Past Medical History:  Diagnosis Date  . Diabetes mellitus without complication (HCC)   . Hypertension   . Kidney stone    Social History   Socioeconomic History  . Marital status: Single    Spouse name: Not on file  . Number of children: Not on file  . Years of education: Not on file  . Highest education level: Not on file  Occupational History  . Not on file  Social Needs  . Financial resource strain: Not on file  . Food insecurity:    Worry: Not on file    Inability: Not on file  . Transportation needs:    Medical: Not on file    Non-medical: Not on file  Tobacco Use  . Smoking status: Light Tobacco Smoker  Substance and Sexual Activity  . Alcohol use: No  . Drug use: No  . Sexual activity: Not on file  Lifestyle  . Physical activity:    Days per week: Not on file    Minutes per session: Not on file  . Stress: Not on file  Relationships  . Social connections:    Talks on phone: Not on file    Gets together: Not on file    Attends religious service: Not on file    Active member of club or organization: Not on file    Attends meetings of clubs or organizations: Not on file    Relationship status: Not on file  . Intimate partner  violence:    Fear of current or ex partner: Not on file    Emotionally abused: Not on file    Physically abused: Not on file    Forced sexual activity: Not on file  Other Topics Concern  . Not on file  Social History Narrative  . Not on file    History reviewed. No pertinent surgical history.    Mardella LaymanHagler, Talli Kimmer, MD 06/14/17 [phone]

## 2017-06-16 ENCOUNTER — Other Ambulatory Visit: Payer: Self-pay | Admitting: Internal Medicine

## 2017-06-16 ENCOUNTER — Ambulatory Visit
Admission: RE | Admit: 2017-06-16 | Discharge: 2017-06-16 | Disposition: A | Discharge status: Home / Self Care | Payer: Managed Care, Other (non HMO) | Source: Ambulatory Visit | Attending: Internal Medicine | Admitting: Internal Medicine

## 2017-06-16 DIAGNOSIS — M5442 Lumbago with sciatica, left side: Secondary | ICD-10-CM

## 2017-08-04 ENCOUNTER — Other Ambulatory Visit: Payer: Self-pay | Admitting: Sports Medicine

## 2017-08-04 DIAGNOSIS — M5440 Lumbago with sciatica, unspecified side: Secondary | ICD-10-CM

## 2017-08-13 ENCOUNTER — Ambulatory Visit
Admission: RE | Admit: 2017-08-13 | Discharge: 2017-08-13 | Disposition: A | Discharge status: Home / Self Care | Payer: Managed Care, Other (non HMO) | Source: Ambulatory Visit | Attending: Sports Medicine | Admitting: Sports Medicine

## 2017-08-13 DIAGNOSIS — M5440 Lumbago with sciatica, unspecified side: Secondary | ICD-10-CM

## 2018-06-19 ENCOUNTER — Other Ambulatory Visit: Payer: Self-pay

## 2018-06-19 ENCOUNTER — Encounter (HOSPITAL_COMMUNITY): Payer: Self-pay | Admitting: Emergency Medicine

## 2018-06-19 ENCOUNTER — Emergency Department (HOSPITAL_COMMUNITY)
Admission: EM | Admit: 2018-06-19 | Discharge: 2018-06-19 | Disposition: A | Discharge status: Home / Self Care | Payer: Managed Care, Other (non HMO) | Attending: Emergency Medicine | Admitting: Emergency Medicine

## 2018-06-19 DIAGNOSIS — I10 Essential (primary) hypertension: Secondary | ICD-10-CM | POA: Insufficient documentation

## 2018-06-19 DIAGNOSIS — Z7984 Long term (current) use of oral hypoglycemic drugs: Secondary | ICD-10-CM | POA: Diagnosis not present

## 2018-06-19 DIAGNOSIS — Z79899 Other long term (current) drug therapy: Secondary | ICD-10-CM | POA: Diagnosis not present

## 2018-06-19 DIAGNOSIS — K529 Noninfective gastroenteritis and colitis, unspecified: Secondary | ICD-10-CM | POA: Diagnosis not present

## 2018-06-19 DIAGNOSIS — F17200 Nicotine dependence, unspecified, uncomplicated: Secondary | ICD-10-CM | POA: Insufficient documentation

## 2018-06-19 DIAGNOSIS — E1165 Type 2 diabetes mellitus with hyperglycemia: Secondary | ICD-10-CM | POA: Diagnosis not present

## 2018-06-19 DIAGNOSIS — R739 Hyperglycemia, unspecified: Secondary | ICD-10-CM

## 2018-06-19 DIAGNOSIS — R1030 Lower abdominal pain, unspecified: Secondary | ICD-10-CM | POA: Diagnosis present

## 2018-06-19 LAB — CBC WITH DIFFERENTIAL/PLATELET
Abs Immature Granulocytes: 0.02 10*3/uL (ref 0.00–0.07)
Basophils Absolute: 0 10*3/uL (ref 0.0–0.1)
Basophils Relative: 0 %
Eosinophils Absolute: 0 10*3/uL (ref 0.0–0.5)
Eosinophils Relative: 0 %
HCT: 53.9 % — ABNORMAL HIGH (ref 39.0–52.0)
Hemoglobin: 17.6 g/dL — ABNORMAL HIGH (ref 13.0–17.0)
Immature Granulocytes: 0 %
Lymphocytes Relative: 14 %
Lymphs Abs: 1.1 10*3/uL (ref 0.7–4.0)
MCH: 27.8 pg (ref 26.0–34.0)
MCHC: 32.7 g/dL (ref 30.0–36.0)
MCV: 85 fL (ref 80.0–100.0)
Monocytes Absolute: 1 10*3/uL (ref 0.1–1.0)
Monocytes Relative: 13 %
Neutro Abs: 5.5 10*3/uL (ref 1.7–7.7)
Neutrophils Relative %: 73 %
Platelets: 258 10*3/uL (ref 150–400)
RBC: 6.34 MIL/uL — ABNORMAL HIGH (ref 4.22–5.81)
RDW: 13.4 % (ref 11.5–15.5)
WBC: 7.6 10*3/uL (ref 4.0–10.5)
nRBC: 0 % (ref 0.0–0.2)

## 2018-06-19 LAB — COMPREHENSIVE METABOLIC PANEL
ALT: 28 U/L (ref 0–44)
AST: 45 U/L — ABNORMAL HIGH (ref 15–41)
Albumin: 3.8 g/dL (ref 3.5–5.0)
Alkaline Phosphatase: 108 U/L (ref 38–126)
Anion gap: 9 (ref 5–15)
BUN: 18 mg/dL (ref 6–20)
CO2: 17 mmol/L — ABNORMAL LOW (ref 22–32)
Calcium: 8.4 mg/dL — ABNORMAL LOW (ref 8.9–10.3)
Chloride: 107 mmol/L (ref 98–111)
Creatinine, Ser: 0.94 mg/dL (ref 0.61–1.24)
GFR calc Af Amer: >60 mL/min (ref >60)
GFR calc non Af Amer: >60 mL/min (ref >60)
Glucose, Bld: 253 mg/dL — ABNORMAL HIGH (ref 70–99)
Potassium: 4.6 mmol/L (ref 3.5–5.1)
Sodium: 133 mmol/L — ABNORMAL LOW (ref 135–145)
Total Bilirubin: 1.1 mg/dL (ref 0.3–1.2)
Total Protein: 7.4 g/dL (ref 6.5–8.1)

## 2018-06-19 LAB — CBG MONITORING, ED
GLUCOSE-CAPILLARY: 257 mg/dL — AB (ref 70–99)
Glucose-Capillary: 245 mg/dL — ABNORMAL HIGH (ref 70–99)

## 2018-06-19 LAB — LIPASE, BLOOD: Lipase: 26 U/L (ref 11–51)

## 2018-06-19 MED ORDER — ONDANSETRON HCL 4 MG PO TABS: 4.0000 mg | ORAL_TABLET | Freq: Four times a day (QID) | ORAL | 0 refills | Status: DC

## 2018-06-19 MED ORDER — SODIUM CHLORIDE 0.9 % IV BOLUS
1000.0000 mL | Freq: Once | INTRAVENOUS | Status: AC
  Administered 2018-06-19: 1000 mL via INTRAVENOUS

## 2018-06-19 NOTE — ED Triage Notes (Signed)
Pt reports N,V, D for several days. Belly pain. Diabetic but not checking his sugars

## 2018-06-19 NOTE — ED Provider Notes (Signed)
[facility] EMERGENCY DEPARTMENT Provider Note   CSN: [id number] Arrival date & time: 06/19/18  0902    History   Chief Complaint Chief Complaint  Patient presents with  . Abdominal Pain    HPI Noah Clayton is a 43 y.o. male.     HPI    43 year old male with a significant past medical history of diabetes, hypertension presents today with complaints of abdominal pain and cramping.  Patient notes symptoms started 2 days ago with lower abdominal cramping diffuse, associated nonbloody diarrhea and some minor vomiting.  He denies any severe abdominal pain, denies any fever.  Denies any recent travel or close sick contacts.  No abnormal food or drink.  Patient has tried drinking Pedialyte but does not like the consistency of it, he has used Imodium without improvement in his diarrhea.  Patient notes that he has not taken his insulin, metformin since Friday approximately 3 days ago secondary to ongoing nausea, diarrhea and vomiting.    Past Medical History:  Diagnosis Date  . Diabetes mellitus without complication (HCC)   . Hypertension   . Kidney stone     There are no active problems to display for this patient.   History reviewed. No pertinent surgical history.      Home Medications    Prior to Admission medications   Medication Sig Start Date End Date Taking? Authorizing Provider  albuterol (PROVENTIL HFA;VENTOLIN HFA) 108 (90 Base) MCG/ACT inhaler Inhale 2 puffs into the lungs every 4 (four) hours as needed for wheezing or shortness of breath. 03/24/16   Charm Rings, MD  diclofenac (VOLTAREN) 75 MG EC tablet Take 1 tablet (75 mg total) by mouth 2 (two) times daily. 06/09/17   Mardella Layman, MD  metFORMIN (GLUCOPHAGE) 500 MG tablet Take 1 tablet (500 mg total) by mouth 2 (two) times daily with a meal. 06/09/17   Mardella Layman, MD  ondansetron (ZOFRAN) 4 MG tablet Take 1 tablet (4 mg total) by mouth every 6 (six) hours. 06/19/18   Eyvonne Mechanic, PA-C     Family History History reviewed. No pertinent family history.  Social History Social History   Tobacco Use  . Smoking status: Light Tobacco Smoker  Substance Use Topics  . Alcohol use: No  . Drug use: No     Allergies   Patient has no known allergies.   Review of Systems Review of Systems  All other systems reviewed and are negative.    Physical Exam Updated Vital Signs BP 112/74 (BP Location: Right Arm)   Pulse 89   Temp 98.2 F (36.8 C) (Oral)   Resp 16   SpO2 100%   Physical Exam Vitals signs and nursing note reviewed.  Constitutional:      Appearance: He is well-developed.  HENT:     Head: Normocephalic and atraumatic.  Eyes:     General: No scleral icterus.       Right eye: No discharge.        Left eye: No discharge.     Conjunctiva/sclera: Conjunctivae normal.     Pupils: Pupils are equal, round, and reactive to light.  Neck:     Musculoskeletal: Normal range of motion.     Vascular: No JVD.     Trachea: No tracheal deviation.  Pulmonary:     Effort: Pulmonary effort is normal.     Breath sounds: No stridor.  Abdominal:     Comments: Minimal lower abdominal tenderness diffuse, nonfocal, nonsevere no rebound or  guarding, no upper abdominal tenderness palpation  Neurological:     Mental Status: He is alert and oriented to person, place, and time.     Coordination: Coordination normal.  Psychiatric:        Behavior: Behavior normal.        Thought Content: Thought content normal.        Judgment: Judgment normal.     ED Treatments / Results  Labs (all labs ordered are listed, but only abnormal results are displayed) Labs Reviewed  CBC WITH DIFFERENTIAL/PLATELET - Abnormal; Notable for the following components:      Result Value   RBC 6.34 (*)    Hemoglobin 17.6 (*)    HCT 53.9 (*)    All other components within normal limits  COMPREHENSIVE METABOLIC PANEL - Abnormal; Notable for the following components:   Sodium 133 (*)    CO2 17  (*)    Glucose, Bld 253 (*)    Calcium 8.4 (*)    AST 45 (*)    All other components within normal limits  CBG MONITORING, ED - Abnormal; Notable for the following components:   Glucose-Capillary 257 (*)    All other components within normal limits  CBG MONITORING, ED - Abnormal; Notable for the following components:   Glucose-Capillary 245 (*)    All other components within normal limits  LIPASE, BLOOD    EKG None  Radiology No results found.  Procedures Procedures (including critical care time)  Medications Ordered in ED Medications  sodium chloride 0.9 % bolus 1,000 mL (0 mLs Intravenous Stopped 06/19/18 1030)  sodium chloride 0.9 % bolus 1,000 mL (1,000 mLs Intravenous New Bag/Given 06/19/18 1055)     Initial Impression / Assessment and Plan / ED Course  I have reviewed the triage vital signs and the nursing notes.  Pertinent labs & imaging results that were available during my care of the patient were reviewed by me and considered in my medical decision making (see chart for details).        Labs: CBG, CBC, CMP, lipase  Imaging:  Consults:  Therapeutics: Normal saline Zofran  Discharge Meds: Zofran  Assessment/Plan: 43 year old male presents today with likely viral gastroenteritis.  He has no fever no elevated white count no signs of acute distress.  I have low suspicion for acute life-threatening etiology include appendicitis, diverticulitis.  Patient tolerating p.o. here, no vomiting while in the emergency room.  His blood sugar elevated at 245 here.  I offered insulin here patient would like to Clayton home and take his own insulin.  He will continue using his diabetic medications, monitor his blood sugar at home.  He will return immediately for develops any new or worsening signs or symptoms and follow-up with his primary care if symptoms persist.  Patient verbalized understanding and agreement to today's plan had no further questions or concerns at the time of  discharge.    Final Clinical Impressions(s) / ED Diagnoses   Final diagnoses:  Gastroenteritis  Hyperglycemia    ED Discharge Orders         Ordered    ondansetron (ZOFRAN) 4 MG tablet  Every 6 hours,   Status:  Discontinued     06/19/18 1202    ondansetron (ZOFRAN) 4 MG tablet  Every 6 hours     06/19/18 1206           Eyvonne Mechanic, PA-C 06/19/18 1206    Gerhard Munch, MD 06/20/18 0800

## 2018-06-19 NOTE — Discharge Instructions (Addendum)
Please read attached information. If you experience any new or worsening signs or symptoms please return to the emergency room for evaluation. Please follow-up with your primary care provider or specialist as discussed. Please use medication prescribed only as directed and discontinue taking if you have any concerning signs or symptoms.   °

## 2018-06-22 ENCOUNTER — Emergency Department (HOSPITAL_COMMUNITY): Payer: Managed Care, Other (non HMO)

## 2018-06-22 ENCOUNTER — Emergency Department (HOSPITAL_COMMUNITY)
Admission: EM | Admit: 2018-06-22 | Discharge: 2018-06-22 | Disposition: A | Discharge status: Home / Self Care | Payer: Managed Care, Other (non HMO) | Attending: Emergency Medicine | Admitting: Emergency Medicine

## 2018-06-22 ENCOUNTER — Encounter (HOSPITAL_COMMUNITY): Payer: Self-pay | Admitting: Emergency Medicine

## 2018-06-22 ENCOUNTER — Other Ambulatory Visit: Payer: Self-pay

## 2018-06-22 DIAGNOSIS — F17200 Nicotine dependence, unspecified, uncomplicated: Secondary | ICD-10-CM | POA: Insufficient documentation

## 2018-06-22 DIAGNOSIS — I10 Essential (primary) hypertension: Secondary | ICD-10-CM | POA: Diagnosis not present

## 2018-06-22 DIAGNOSIS — R1030 Lower abdominal pain, unspecified: Secondary | ICD-10-CM | POA: Insufficient documentation

## 2018-06-22 DIAGNOSIS — Z7984 Long term (current) use of oral hypoglycemic drugs: Secondary | ICD-10-CM | POA: Insufficient documentation

## 2018-06-22 DIAGNOSIS — R197 Diarrhea, unspecified: Secondary | ICD-10-CM | POA: Diagnosis not present

## 2018-06-22 DIAGNOSIS — E119 Type 2 diabetes mellitus without complications: Secondary | ICD-10-CM | POA: Insufficient documentation

## 2018-06-22 LAB — URINALYSIS, ROUTINE W REFLEX MICROSCOPIC
Bacteria, UA: NONE SEEN
Bilirubin Urine: NEGATIVE
Glucose, UA: NEGATIVE mg/dL
Hgb urine dipstick: NEGATIVE
Ketones, ur: NEGATIVE mg/dL
Leukocytes,Ua: NEGATIVE
Nitrite: NEGATIVE
Protein, ur: 30 mg/dL — AB
Specific Gravity, Urine: 1.029 (ref 1.005–1.030)
pH: 5 (ref 5.0–8.0)

## 2018-06-22 LAB — CBC WITH DIFFERENTIAL/PLATELET
Abs Immature Granulocytes: 0.03 10*3/uL (ref 0.00–0.07)
Basophils Absolute: 0 10*3/uL (ref 0.0–0.1)
Basophils Relative: 0 %
Eosinophils Absolute: 0.1 10*3/uL (ref 0.0–0.5)
Eosinophils Relative: 1 %
HCT: 48.9 % (ref 39.0–52.0)
Hemoglobin: 15.7 g/dL (ref 13.0–17.0)
Immature Granulocytes: 0 %
Lymphocytes Relative: 28 %
Lymphs Abs: 2.5 10*3/uL (ref 0.7–4.0)
MCH: 27.5 pg (ref 26.0–34.0)
MCHC: 32.1 g/dL (ref 30.0–36.0)
MCV: 85.8 fL (ref 80.0–100.0)
Monocytes Absolute: 1.8 10*3/uL — ABNORMAL HIGH (ref 0.1–1.0)
Monocytes Relative: 20 %
Neutro Abs: 4.5 10*3/uL (ref 1.7–7.7)
Neutrophils Relative %: 51 %
Platelets: 263 10*3/uL (ref 150–400)
RBC: 5.7 MIL/uL (ref 4.22–5.81)
RDW: 13.4 % (ref 11.5–15.5)
WBC: 9 10*3/uL (ref 4.0–10.5)
nRBC: 0 % (ref 0.0–0.2)

## 2018-06-22 LAB — C DIFFICILE QUICK SCREEN W PCR REFLEX??: C Diff antigen: NEGATIVE

## 2018-06-22 LAB — COMPREHENSIVE METABOLIC PANEL
ALT: 37 U/L (ref 0–44)
AST: 38 U/L (ref 15–41)
Albumin: 3.8 g/dL (ref 3.5–5.0)
Alkaline Phosphatase: 100 U/L (ref 38–126)
Anion gap: 11 (ref 5–15)
BUN: 15 mg/dL (ref 6–20)
CO2: 22 mmol/L (ref 22–32)
Calcium: 8.9 mg/dL (ref 8.9–10.3)
Chloride: 103 mmol/L (ref 98–111)
Creatinine, Ser: 1.01 mg/dL (ref 0.61–1.24)
GFR calc Af Amer: >60 mL/min (ref >60)
GFR calc non Af Amer: >60 mL/min (ref >60)
Glucose, Bld: 190 mg/dL — ABNORMAL HIGH (ref 70–99)
Potassium: 3.6 mmol/L (ref 3.5–5.1)
Sodium: 136 mmol/L (ref 135–145)
Total Bilirubin: 1.3 mg/dL — ABNORMAL HIGH (ref 0.3–1.2)
Total Protein: 7.4 g/dL (ref 6.5–8.1)

## 2018-06-22 LAB — LIPASE, BLOOD: Lipase: 157 U/L — ABNORMAL HIGH (ref 11–51)

## 2018-06-22 LAB — C DIFFICILE QUICK SCREEN W PCR REFLEX
C Diff interpretation: NOT DETECTED
C Diff toxin: NEGATIVE

## 2018-06-22 MED ORDER — GLUCOSE BLOOD VI STRP: ORAL_STRIP | 12 refills | Status: AC

## 2018-06-22 MED ORDER — DIPHENOXYLATE-ATROPINE 2.5-0.025 MG PO TABS: 1.0000 | ORAL_TABLET | Freq: Four times a day (QID) | ORAL | 0 refills | Status: DC | PRN

## 2018-06-22 MED ORDER — SODIUM CHLORIDE 0.9 % IV BOLUS
1000.0000 mL | Freq: Once | INTRAVENOUS | Status: AC
  Administered 2018-06-22: 1000 mL via INTRAVENOUS

## 2018-06-22 MED ORDER — IOHEXOL 300 MG/ML  SOLN
100.0000 mL | Freq: Once | INTRAMUSCULAR | Status: AC | PRN
  Administered 2018-06-22: 100 mL via INTRAVENOUS

## 2018-06-22 NOTE — Discharge Instructions (Addendum)
You were evaluated today for diarrhea.  Your work-up was negative in the department.  Your CT scan was also negative.  I have given you a prescription for an antidiarrheal agent.  Please take as needed.  I would also suggest clear liquids over the next 24 to 36 hours until your symptoms have resolve.  I have also you given a prescription for your diabetes test strips.  Follow-up with your PCP for reevaluation.

## 2018-06-22 NOTE — ED Provider Notes (Signed)
Noah Clayton [facility] EMERGENCY DEPARTMENT Provider Note   CSN: [id number] Arrival date & time: 06/22/18  1214   History   Chief Complaint Chief Complaint  Patient presents with   Diarrhea    HPI [name] is a 43 y.o. male with past medical history significant for diabetes, hypertension who presents for evaluation of diarrhea.  Patient states he was seen 3 days ago, 06/19/18 for nausea and diarrhea.  States he received a liter of fluids at that time.  Did not have imaging obtained.  Patient states he has had persistent diarrhea x3 days.  States he is having 4 episodes of diarrhea daily.  Diarrhea is NBNB.  Has not had any episodes of emesis or nausea.  Denies recent antibiotic use, recent travel, suspicious food intake or contact with anyone with similar symptoms.  States he has not had similar symptoms previously.  He has never been in by gastroenterologist.  Denies ever, chills, nausea, vomiting, chest pain, shortness of breath, dysuria, constipation.  Patient states he did have mild lower abdominal cramping which occurs just previously to his episodes of diarrhea.  He has no current pain.  Patient states when he gets this cramping it is rated a 6/10.  Denies any radiation of pain.  Denies previous colonoscopy.  States he has not been taking his insulin or his metformin due to his symptoms.  Patient states when he takes his metformin he gets diarrhea which is why he has not been taking this.  He does not check his blood sugars at home.  Patient states he is also not been taking his blood pressure medication.  He has been able to tolerate p.o. intake at home, however states he has been trying to stick with bland foods that he knows have not previously upset his stomach.  Denies previous abdominal surgeries.  No family history of IBD.  Last p.o. intake 9 AM this morning.  History obtained from patient.  No interpreter was used.     HPI  Past Medical History:  Diagnosis  Date   Diabetes mellitus without complication (HCC)    Hypertension    Kidney stone     There are no active problems to display for this patient.   History reviewed. No pertinent surgical history.      Home Medications    Prior to Admission medications   Medication Sig Start Date End Date Taking? Authorizing Provider  albuterol (PROVENTIL HFA;VENTOLIN HFA) 108 (90 Base) MCG/ACT inhaler Inhale 2 puffs into the lungs every 4 (four) hours as needed for wheezing or shortness of breath. 03/24/16   Charm Rings, MD  diclofenac (VOLTAREN) 75 MG EC tablet Take 1 tablet (75 mg total) by mouth 2 (two) times daily. 06/09/17   Mardella Layman, MD  diphenoxylate-atropine (LOMOTIL) 2.5-0.025 MG tablet Take 1 tablet by mouth 4 (four) times daily as needed for diarrhea or loose stools. 06/22/18   Geneen Dieter A, PA-C  glucose blood test strip Use as instructed 06/22/18   Iyahna Obriant A, PA-C  metFORMIN (GLUCOPHAGE) 500 MG tablet Take 1 tablet (500 mg total) by mouth 2 (two) times daily with a meal. 06/09/17   Mardella Layman, MD  ondansetron (ZOFRAN) 4 MG tablet Take 1 tablet (4 mg total) by mouth every 6 (six) hours. 06/19/18   Eyvonne Mechanic, PA-C    Family History No family history on file.  Social History Social History   Tobacco Use   Smoking status: Light Tobacco Smoker  Substance Use  Topics   Alcohol use: No   Drug use: No     Allergies   Patient has no known allergies.   Review of Systems Review of Systems  Constitutional: Negative.   HENT: Negative.   Eyes: Negative.   Respiratory: Negative.   Cardiovascular: Negative.   Gastrointestinal: Positive for diarrhea. Negative for abdominal distention, abdominal pain, anal bleeding, blood in stool, constipation, nausea, rectal pain and vomiting.  Genitourinary: Negative.   Musculoskeletal: Negative.   Skin: Negative.   Neurological: Negative.   All other systems reviewed and are negative.    Physical Exam Updated  Vital Signs BP (!) 149/102 (BP Location: Right Arm)    Pulse 93    Temp 97.8 F (36.6 C) (Oral)    Resp 16    SpO2 98%   Physical Exam Vitals signs and nursing note reviewed.  Constitutional:      General: He is not in acute distress.    Appearance: He is well-developed. He is not ill-appearing, toxic-appearing or diaphoretic.     Comments: Sitting in bed talking on phone on initial evaluation.  No acute distress noted.  HENT:     Head: Atraumatic.     Nose:     Comments: No congestion or rhinorrhea.    Mouth/Throat:     Comments: Posterior oropharynx clear.  Mucous membranes moist. Eyes:     Pupils: Pupils are equal, round, and reactive to light.  Neck:     Musculoskeletal: Normal range of motion and neck supple.  Cardiovascular:     Rate and Rhythm: Normal rate and regular rhythm.     Pulses: Normal pulses.     Heart sounds: Normal heart sounds.     Comments: No murmurs, rubs or gallops.  2+ DP pulses bilaterally Pulmonary:     Effort: Pulmonary effort is normal. No respiratory distress.     Comments: Clear to auscultation bilateral without wheeze, rhonchi or rales.  No accessory muscle usage.  Able speak in full sentences without difficulty. Abdominal:     General: There is no distension.     Palpations: Abdomen is soft.     Comments: Soft, nontender without rebound or guarding.  Normoactive bowel sounds.  Musculoskeletal: Normal range of motion.     Comments: Moves all 4 extremities without difficulty.  Skin:    General: Skin is warm and dry.     Comments: No rashes, erythema or warmth.  Brisk capillary refill.  Neurological:     Mental Status: He is alert.     Comments: No facial droop.  Cranial nerves II through XII grossly intact.  Ambulates in department that difficulty.      ED Treatments / Results  Labs (all labs ordered are listed, but only abnormal results are displayed) Labs Reviewed  CBC WITH DIFFERENTIAL/PLATELET - Abnormal; Notable for the following  components:      Result Value   Monocytes Absolute 1.8 (*)    All other components within normal limits  COMPREHENSIVE METABOLIC PANEL - Abnormal; Notable for the following components:   Glucose, Bld 190 (*)    Total Bilirubin 1.3 (*)    All other components within normal limits  LIPASE, BLOOD - Abnormal; Notable for the following components:   Lipase 157 (*)    All other components within normal limits  URINALYSIS, ROUTINE W REFLEX MICROSCOPIC - Abnormal; Notable for the following components:   Protein, ur 30 (*)    All other components within normal limits  C DIFFICILE QUICK  SCREEN W PCR REFLEX  GASTROINTESTINAL PANEL BY PCR, STOOL (REPLACES STOOL CULTURE)    EKG None  Radiology Ct Abdomen Pelvis W Contrast  Result Date: 06/22/2018 CLINICAL DATA:  Diarrhea. EXAM: CT ABDOMEN AND PELVIS WITH CONTRAST TECHNIQUE: Multidetector CT imaging of the abdomen and pelvis was performed using the standard protocol following bolus administration of intravenous contrast. CONTRAST:  OMNIPAQUE IOHEXOL 300 MG/ML  SOLN COMPARISON:  CT scan of October 28, 2009. FINDINGS: Lower chest: No acute abnormality. Hepatobiliary: No focal liver abnormality is seen. No gallstones, gallbladder wall thickening, or biliary dilatation. Pancreas: Unremarkable. No pancreatic ductal dilatation or surrounding inflammatory changes. Spleen: Normal in size without focal abnormality. Adrenals/Urinary Tract: Adrenal glands are unremarkable. Kidneys are normal, without renal calculi, focal lesion, or hydronephrosis. Bladder is unremarkable. Stomach/Bowel: Stomach is within normal limits. Appendix appears normal. No evidence of bowel wall thickening, distention, or inflammatory changes. Vascular/Lymphatic: No significant vascular findings are present. No enlarged abdominal or pelvic lymph nodes. Reproductive: Prostate is unremarkable. Other: No abdominal wall hernia or abnormality. No abdominopelvic ascites. Musculoskeletal: No  acute or significant osseous findings. IMPRESSION: No abnormality seen in the abdomen or pelvis. Electronically Signed   By: Lupita Raider, M.D.   On: 06/22/2018 16:11    Procedures Procedures (including critical care time)  Medications Ordered in ED Medications  sodium chloride 0.9 % bolus 1,000 mL (0 mLs Intravenous Stopped 06/22/18 1510)  iohexol (OMNIPAQUE) 300 MG/ML solution 100 mL (100 mLs Intravenous Contrast Given 06/22/18 1554)   Initial Impression / Assessment and Plan / ED Course  I have reviewed the triage vital signs and the nursing notes.  Pertinent labs & imaging results that were available during my care of the patient were reviewed by me and considered in my medical decision making (see chart for details).  43 year old male appears otherwise well presents for evaluation of diarrhea.  Symptom onset 3 days ago.  Afebrile, nonseptic, non-ill-appearing.  Patient with 4 episode of NB  diarrhea.  No recent antibiotic use, recent travel, known exposures to contacts.  Has had some mild lower abdominal tenderness which occurs just previously to his episodes of diarrhea.  Describes this as cramping.  Abdomen soft, nontender without rebound or guarding on exam.  Mucous membranes moist.  He has not been taking his home medications of metformin, insulin or his hypertensive medications. He is hypertensive in department.  he does not want to take his home medications at this time.  He has not had any episodes of emesis.  He has had normal p.o. intake.  Will order labs, imaging, fluid, urinalysis and reevaluate.  Will also obtain stool sample given history.  1620: CBC leukocytosis, and Metabolic panel with hyperglycemia history of diabetes does not take medications, urinalysis negative for infection, C. difficile negative, lipase 157, gastrointestinal panel pending.  On reevaluation abdomen soft without rebound or guarding.  He is no epigastric abdominal tenderness.  CT scan negative for acute  abdominopelvic pathology, specifically no evidence of pancreatitis, diverticulitis. Patient able to tolerate PO intake in ED without difficulty.  Patient is nontoxic, nonseptic appearing, in no apparent distress.  Patient's pain and other symptoms adequately managed in emergency department.  Fluid bolus given.  Labs, imaging and vitals reviewed.  Patient does not meet the SIRS or Sepsis criteria.  On repeat exam patient does not have a surgical abdomin and there are no peritoneal signs.  No indication of appendicitis, bowel obstruction, bowel perforation, cholecystitis, diverticulitis.  Discussed clear liquid diet over next  1-2 days until symptoms resolved.  Patient also given medication for diarrhea.  States he will follow-up with PCP for results and GI panel.  Not want to wait for these in department. Patient discharged home with symptomatic treatment and given strict instructions for follow-up with their primary care physician.  I have also discussed reasons to return immediately to the ER.  Patient expresses understanding and agrees with plan.  Final Clinical Impressions(s) / ED Diagnoses   Final diagnoses:  Diarrhea, unspecified type    ED Discharge Orders         Ordered    diphenoxylate-atropine (LOMOTIL) 2.5-0.025 MG tablet  4 times daily PRN     06/22/18 1616    glucose blood test strip     06/22/18 1616           Maimuna Leaman A, PA-C 06/22/18 1626    Benjiman Core, MD 06/23/18 1134

## 2018-06-22 NOTE — ED Triage Notes (Signed)
Pt was seen on 3/30 for n/v/d and was treated pt states since he has had constant diarrhea. 4 episodes in the last 24 hours- pt has cramping in lower abd.

## 2018-06-23 LAB — GASTROINTESTINAL PANEL BY PCR, STOOL (REPLACES STOOL CULTURE)
Adenovirus F40/41: NOT DETECTED
Astrovirus: NOT DETECTED
Campylobacter species: NOT DETECTED
Cryptosporidium: DETECTED — AB
Cyclospora cayetanensis: NOT DETECTED
Entamoeba histolytica: NOT DETECTED
Enteroaggregative E coli (EAEC): DETECTED — AB
Enteropathogenic E coli (EPEC): NOT DETECTED
Enterotoxigenic E coli (ETEC): NOT DETECTED
Giardia lamblia: DETECTED — AB
Norovirus GI/GII: NOT DETECTED
Plesimonas shigelloides: NOT DETECTED
Rotavirus A: NOT DETECTED
Salmonella species: NOT DETECTED
Sapovirus (I, II, IV, and V): NOT DETECTED
Shiga like toxin producing E coli (STEC): NOT DETECTED
Shigella/Enteroinvasive E coli (EIEC): NOT DETECTED
Vibrio cholerae: NOT DETECTED
Vibrio species: NOT DETECTED
Yersinia enterocolitica: NOT DETECTED

## 2018-12-14 ENCOUNTER — Other Ambulatory Visit: Payer: Self-pay

## 2018-12-14 DIAGNOSIS — Z20822 Contact with and (suspected) exposure to covid-19: Secondary | ICD-10-CM

## 2018-12-15 LAB — NOVEL CORONAVIRUS, NAA: SARS-CoV-2, NAA: NOT DETECTED

## 2019-02-20 DIAGNOSIS — U071 COVID-19: Secondary | ICD-10-CM

## 2019-02-20 HISTORY — DX: COVID-19: U07.1

## 2019-03-13 ENCOUNTER — Other Ambulatory Visit: Payer: Managed Care, Other (non HMO)

## 2019-03-13 ENCOUNTER — Ambulatory Visit: Payer: Managed Care, Other (non HMO) | Attending: Internal Medicine

## 2019-03-13 DIAGNOSIS — Z20822 Contact with and (suspected) exposure to covid-19: Secondary | ICD-10-CM

## 2019-03-14 ENCOUNTER — Other Ambulatory Visit: Payer: Self-pay

## 2019-03-14 ENCOUNTER — Encounter (HOSPITAL_COMMUNITY): Payer: Self-pay | Admitting: Emergency Medicine

## 2019-03-14 ENCOUNTER — Ambulatory Visit (HOSPITAL_COMMUNITY)
Admission: EM | Admit: 2019-03-14 | Discharge: 2019-03-14 | Disposition: A | Discharge status: Home / Self Care | Payer: Managed Care, Other (non HMO) | Attending: Family Medicine | Admitting: Family Medicine

## 2019-03-14 DIAGNOSIS — U071 COVID-19: Secondary | ICD-10-CM | POA: Diagnosis not present

## 2019-03-14 LAB — POC SARS CORONAVIRUS 2 AG -  ED: SARS Coronavirus 2 Ag: POSITIVE — AB

## 2019-03-14 LAB — POC SARS CORONAVIRUS 2 AG: SARS Coronavirus 2 Ag: POSITIVE — AB

## 2019-03-14 NOTE — ED Notes (Signed)
Spoke to dr hagler about patient, ordered rapid test

## 2019-03-14 NOTE — ED Triage Notes (Addendum)
Symptoms started 03/06/2019 with stomach pain and vomiting.  This has resolved.. 03/10/2019  had a fever, but is not running a fever any longer.  Complains of productive cough. Sob yesterday.

## 2019-03-15 LAB — NOVEL CORONAVIRUS, NAA: SARS-CoV-2, NAA: DETECTED — AB

## 2019-03-15 NOTE — ED Provider Notes (Signed)
[place]   [id number] 03/14/19 Arrival Time: 2119  ASSESSMENT & PLAN:  1. COVID-19 virus infection     Rapid testing positive. Quarantine instructions provided. OTC symptom care as needed. No distress. No indication for hospitalization.  Follow-up Information    [facility]   Specialty: Emergency Medicine Why: If symptoms worsen significantly. Contact information: [address] [phone] [place] [phone]          Reviewed expectations re: course of current medical issues. Questions answered. Outlined signs and symptoms indicating need for more acute intervention. Patient verbalized understanding. After Visit Summary given.   SUBJECTIVE: History from: patient. Noah Clayton is a 43 y.o. male who requests COVID-19 testing. Known COVID-19 contact: unsure. Recent travel: none. Reports subjective fevers several days ago along with a productive cough. Mild SOB yesterday. Initially felt "stomach pain and throwing up" but this has resolved. Denies: runny nose, congestion, sore throat and headache. Normal PO intake without n/v/d.  ROS: As per HPI.   OBJECTIVE:  Vitals:   03/14/19 1850  BP: 134/82  Pulse: 72  Resp: (!) 24  Temp: 100.1 F (37.8 C)  TempSrc: Oral  SpO2: 98%    Recheck RR: 18 General appearance: alert; no distress Eyes: PERRLA; EOMI; conjunctiva normal HENT: [place]; AT; nasal mucosa normal; oral mucosa normal Neck: supple  Lungs: speaks full sentences without difficulty; unlabored Heart: regular rate and rhythm Abdomen: soft, non-tender Extremities: no edema Skin: warm and dry Neurologic: normal gait Psychological: alert and cooperative; normal mood and affect  Labs:  Labs Reviewed  POC SARS CORONAVIRUS 2 AG -  ED - Abnormal; Notable for the following components:      Result Value   SARS Coronavirus 2 Ag POSITIVE (*)    All other components within  normal limits  POC SARS CORONAVIRUS 2 AG - Abnormal; Notable for the following components:   SARS Coronavirus 2 Ag POSITIVE (*)    All other components within normal limits     No Known Allergies  Past Medical History:  Diagnosis Date  . Diabetes mellitus without complication ([facility])   . Hypertension   . Kidney stone    Social History   Socioeconomic History  . Marital status: Single    Spouse name: Not on file  . Number of children: Not on file  . Years of education: Not on file  . Highest education level: Not on file  Occupational History  . Not on file  Tobacco Use  . Smoking status: Light Tobacco Smoker  Substance and Sexual Activity  . Alcohol use: No  . Drug use: No  . Sexual activity: Not on file  Other Topics Concern  . Not on file  Social History Narrative  . Not on file   Social Determinants of Health   Financial Resource Strain:   . Difficulty of Paying Living Expenses: Not on file  Food Insecurity:   . Worried About Charity fundraiser in the Last Year: Not on file  . Ran Out of Food in the Last Year: Not on file  Transportation Needs:   . Lack of Transportation (Medical): Not on file  . Lack of Transportation (Non-Medical): Not on file  Physical Activity:   . Days of Exercise per Week: Not on file  . Minutes of Exercise per Session: Not on file  Stress:   . Feeling of Stress : Not on file  Social Connections:   . Frequency of  Communication with Friends and Family: Not on file  . Frequency of Social Gatherings with Friends and Family: Not on file  . Attends Religious Services: Not on file  . Active Member of Clubs or Organizations: Not on file  . Attends Banker Meetings: Not on file  . Marital Status: Not on file  Intimate Partner Violence:   . Fear of Current or Ex-Partner: Not on file  . Emotionally Abused: Not on file  . Physically Abused: Not on file  . Sexually Abused: Not on file   Family History  Problem Relation Age of  Onset  . Diabetes Mother   . Diabetes Father    History reviewed. No pertinent surgical history.   Mardella Layman, MD 03/15/19 [phone]

## 2019-05-26 ENCOUNTER — Ambulatory Visit: Payer: Managed Care, Other (non HMO) | Attending: Internal Medicine

## 2019-05-26 DIAGNOSIS — Z23 Encounter for immunization: Secondary | ICD-10-CM | POA: Insufficient documentation

## 2019-05-26 NOTE — Progress Notes (Signed)
   Covid-19 Vaccination Clinic  Name:  ELIZANDRO LAURA    MRN: [id number] DOB: Mar 20, 1976  05/26/2019  Mr. Hartsfield was observed post Covid-19 immunization for 15 minutes without incident. He was provided with Vaccine Information Sheet and instruction to access the V-Safe system.   Mr. Shappell was instructed to call 911 with any severe reactions post vaccine: Marland Kitchen Difficulty breathing  . Swelling of face and throat  . A fast heartbeat  . A bad rash all over body  . Dizziness and weakness   Immunizations Administered    Name Date Dose VIS Date Route   Pfizer COVID-19 Vaccine 05/26/2019  1:31 PM 0.3 mL 03/02/2019 Intramuscular   Manufacturer: [employer], [employer]   Lot: [id number]   NDC: [address]-5

## 2019-06-14 ENCOUNTER — Ambulatory Visit: Payer: Managed Care, Other (non HMO) | Attending: Internal Medicine

## 2019-06-14 DIAGNOSIS — Z23 Encounter for immunization: Secondary | ICD-10-CM

## 2019-06-14 NOTE — Progress Notes (Signed)
   Covid-19 Vaccination Clinic  Name:  Noah Clayton    MRN: [id number] DOB: 1975/11/18  06/14/2019  [name] was observed post Covid-19 immunization for 15 minutes without incident. He was provided with Vaccine Information Sheet and instruction to access the V-Safe system.   [name] was instructed to call 911 with any severe reactions post vaccine: Marland Kitchen Difficulty breathing  . Swelling of face and throat  . A fast heartbeat  . A bad rash all over body  . Dizziness and weakness   Immunizations Administered    Name Date Dose VIS Date Route   Pfizer COVID-19 Vaccine 06/14/2019 12:08 PM 0.3 mL 03/02/2019 Intramuscular   Manufacturer: [employer], [employer]   Lot: [id number]   NDC: [address]-4

## 2019-06-16 ENCOUNTER — Ambulatory Visit: Payer: Managed Care, Other (non HMO)

## 2019-11-11 ENCOUNTER — Other Ambulatory Visit: Payer: Self-pay

## 2019-11-11 ENCOUNTER — Encounter (HOSPITAL_COMMUNITY): Payer: Self-pay

## 2019-11-11 ENCOUNTER — Ambulatory Visit (HOSPITAL_COMMUNITY)
Admission: EM | Admit: 2019-11-11 | Discharge: 2019-11-11 | Disposition: A | Discharge status: Home / Self Care | Payer: Managed Care, Other (non HMO) | Attending: Urgent Care | Admitting: Urgent Care

## 2019-11-11 DIAGNOSIS — Z79899 Other long term (current) drug therapy: Secondary | ICD-10-CM | POA: Diagnosis not present

## 2019-11-11 DIAGNOSIS — E119 Type 2 diabetes mellitus without complications: Secondary | ICD-10-CM | POA: Diagnosis not present

## 2019-11-11 DIAGNOSIS — Z8616 Personal history of COVID-19: Secondary | ICD-10-CM | POA: Insufficient documentation

## 2019-11-11 DIAGNOSIS — Z20822 Contact with and (suspected) exposure to covid-19: Secondary | ICD-10-CM | POA: Diagnosis not present

## 2019-11-11 DIAGNOSIS — I10 Essential (primary) hypertension: Secondary | ICD-10-CM | POA: Diagnosis not present

## 2019-11-11 DIAGNOSIS — R0981 Nasal congestion: Secondary | ICD-10-CM | POA: Diagnosis not present

## 2019-11-11 DIAGNOSIS — Z833 Family history of diabetes mellitus: Secondary | ICD-10-CM | POA: Insufficient documentation

## 2019-11-11 DIAGNOSIS — Z7984 Long term (current) use of oral hypoglycemic drugs: Secondary | ICD-10-CM | POA: Diagnosis not present

## 2019-11-11 DIAGNOSIS — Z87442 Personal history of urinary calculi: Secondary | ICD-10-CM | POA: Diagnosis not present

## 2019-11-11 DIAGNOSIS — J069 Acute upper respiratory infection, unspecified: Secondary | ICD-10-CM

## 2019-11-11 LAB — SARS CORONAVIRUS 2 (TAT 6-24 HRS): SARS Coronavirus 2: NEGATIVE

## 2019-11-11 MED ORDER — CETIRIZINE HCL 10 MG PO TABS: 10.0000 mg | ORAL_TABLET | Freq: Every day | ORAL | 0 refills | Status: DC

## 2019-11-11 MED ORDER — BENZONATATE 100 MG PO CAPS: 100.0000 mg | ORAL_CAPSULE | Freq: Three times a day (TID) | ORAL | 0 refills | Status: DC | PRN

## 2019-11-11 MED ORDER — PROMETHAZINE-DM 6.25-15 MG/5ML PO SYRP: 5.0000 mL | ORAL_SOLUTION | Freq: Every evening | ORAL | 0 refills | Status: DC | PRN

## 2019-11-11 MED ORDER — PSEUDOEPHEDRINE HCL 30 MG PO TABS: 30.0000 mg | ORAL_TABLET | Freq: Three times a day (TID) | ORAL | 0 refills | Status: DC | PRN

## 2019-11-11 NOTE — ED Provider Notes (Signed)
[facility]   MRN: [id number] DOB: [birth year]  Subjective:   Noah Clayton is a 44 y.o. male presenting for 2 to 3-day history of sinus congestion, sinus headaches, cough.  Denies loss of sense of taste and smell, fevers, chest pain, shortness of breath, heart racing, belly pain, body aches.  Patient has had both Covid vaccinations.  He is largely been working from home.  No sick contacts to his knowledge.  He has a son at home, has not done his vaccine but has not been ill either.  Of note, patient has had COVID-19 back in December 2020.  No current facility-administered medications for this encounter.  Current Outpatient Medications:  .  glucose blood test strip, Use as instructed, Disp: 100 each, Rfl: 12 .  metFORMIN (GLUCOPHAGE) 500 MG tablet, Take 1 tablet (500 mg total) by mouth 2 (two) times daily with a meal. (Patient taking differently: Take 750 mg by mouth 2 (two) times daily with a meal. ), Disp: 60 tablet, Rfl: 1 .  ondansetron (ZOFRAN) 4 MG tablet, Take 1 tablet (4 mg total) by mouth every 6 (six) hours., Disp: 12 tablet, Rfl: 0   No Known Allergies  Past Medical History:  Diagnosis Date  . Diabetes mellitus without complication ([facility])   . Hypertension   . Kidney stone      History reviewed. No pertinent surgical history.  Family History  Problem Relation Age of Onset  . Diabetes Mother   . Diabetes Father     Social History   Tobacco Use  . Smoking status: Light Tobacco Smoker  . Smokeless tobacco: Never Used  Substance Use Topics  . Alcohol use: No  . Drug use: No    ROS   Objective:   Vitals: BP (!) 155/91 (BP Location: Right Arm)   Pulse 91   Temp 98.6 F (37 C) (Oral)   Resp 18   SpO2 98%   Physical Exam Constitutional:      General: He is not in acute distress.    Appearance: Normal appearance. He is well-developed. He is not ill-appearing, toxic-appearing or diaphoretic.  HENT:     Head: Normocephalic and atraumatic.      Right Ear: External ear normal.     Left Ear: External ear normal.     Nose: Nose normal.     Mouth/Throat:     Mouth: Mucous membranes are moist.     Pharynx: Oropharynx is clear.  Eyes:     General: No scleral icterus.    Extraocular Movements: Extraocular movements intact.     Pupils: Pupils are equal, round, and reactive to light.  Cardiovascular:     Rate and Rhythm: Normal rate and regular rhythm.     Heart sounds: Normal heart sounds. No murmur heard.  No friction rub. No gallop.   Pulmonary:     Effort: Pulmonary effort is normal. No respiratory distress.     Breath sounds: Normal breath sounds. No stridor. No wheezing, rhonchi or rales.  Neurological:     Mental Status: He is alert and oriented to person, place, and time.  Psychiatric:        Mood and Affect: Mood normal.        Behavior: Behavior normal.        Thought Content: Thought content normal.      Assessment and Plan :   PDMP not reviewed this encounter.  1. Viral URI with cough   2. Nasal congestion  Will manage for viral illness such as viral URI, viral syndrome, viral rhinitis, COVID-19. Counseled patient on nature of COVID-19 including modes of transmission, diagnostic testing, management and supportive care.  Offered scripts for symptomatic relief. COVID 19 testing is pending. Counseled patient on potential for adverse effects with medications prescribed/recommended today, ER and return-to-clinic precautions discussed, patient verbalized understanding.     Wallis Bamberg, PA-C 11/11/19 1256

## 2019-11-11 NOTE — ED Triage Notes (Signed)
Pt presents with headache, nasal congestion and cough x 2 days. Denies fever, sore throat or any other symptoms.

## 2019-11-11 NOTE — Discharge Instructions (Signed)

## 2019-12-18 ENCOUNTER — Ambulatory Visit (HOSPITAL_COMMUNITY)
Admission: EM | Admit: 2019-12-18 | Discharge: 2019-12-18 | Disposition: A | Discharge status: Home / Self Care | Payer: Managed Care, Other (non HMO)

## 2019-12-18 ENCOUNTER — Other Ambulatory Visit: Payer: Self-pay

## 2019-12-18 ENCOUNTER — Encounter (HOSPITAL_COMMUNITY): Payer: Self-pay | Admitting: Emergency Medicine

## 2019-12-18 DIAGNOSIS — K921 Melena: Secondary | ICD-10-CM

## 2019-12-18 DIAGNOSIS — M79605 Pain in left leg: Secondary | ICD-10-CM

## 2019-12-18 DIAGNOSIS — M7989 Other specified soft tissue disorders: Secondary | ICD-10-CM

## 2019-12-18 DIAGNOSIS — Z86718 Personal history of other venous thrombosis and embolism: Secondary | ICD-10-CM

## 2019-12-18 NOTE — Discharge Instructions (Addendum)
Please call [phone] tomorrow at 8 AM to schedule your ultrasound to rule out a blood clot in your left lower leg.  Tonight, you can continue to use Tylenol at a dose of 500 mg to 650 mg for pain.  You can use a compression stocking as well.  If you develop chest pain, shortness of breath then please report to the emergency room as this could be a sign that your possible blood clot has traveled to your lung.  Make sure you contact your regular doctor to request a referral to gastroenterology for your bloody stools.

## 2019-12-18 NOTE — ED Triage Notes (Signed)
Pt presents with leg swelling and pain xs 1 wk. States has hx of blood clots.   C/o blood in stool xs 2 days with abdominal discomfort.

## 2019-12-19 ENCOUNTER — Other Ambulatory Visit (HOSPITAL_COMMUNITY): Payer: Self-pay | Admitting: Urgent Care

## 2019-12-19 ENCOUNTER — Telehealth (HOSPITAL_COMMUNITY): Payer: Self-pay | Admitting: Urgent Care

## 2019-12-19 ENCOUNTER — Ambulatory Visit (HOSPITAL_COMMUNITY)
Admission: RE | Admit: 2019-12-19 | Discharge: 2019-12-19 | Disposition: A | Discharge status: Home / Self Care | Payer: Managed Care, Other (non HMO) | Source: Ambulatory Visit | Attending: Urgent Care | Admitting: Urgent Care

## 2019-12-19 DIAGNOSIS — M7989 Other specified soft tissue disorders: Secondary | ICD-10-CM | POA: Insufficient documentation

## 2019-12-19 DIAGNOSIS — M79605 Pain in left leg: Secondary | ICD-10-CM | POA: Diagnosis not present

## 2019-12-19 MED ORDER — RIVAROXABAN (XARELTO) VTE STARTER PACK (15 & 20 MG): ORAL_TABLET | ORAL | 0 refills | Status: AC

## 2019-12-19 NOTE — Progress Notes (Signed)
Left lower extremity venous duplex has been completed. Preliminary results can be found in CV Proc through chart review.  Results were given to [facility] PA.  12/19/19 3:33 PM Olen Cordial RVT

## 2019-12-19 NOTE — Telephone Encounter (Signed)
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  Recommended starting Xarelto Dosepak.  Patient already discussed needing follow-up with his PCP.  Recommended he follow-up with vascular surgeon and obtain a consult with vascular surgery through his PCP. Counseled patient on potential for adverse effects with medications prescribed/recommended today, ER and return-to-clinic precautions discussed, patient verbalized understanding.

## 2019-12-19 NOTE — ED Provider Notes (Signed)
[name]   MRN: [id number] DOB: 12-Jun-1975  Subjective:   Noah Clayton is a 44 y.o. male presenting for 1 wk hx of persistent and worsening right lower leg swelling, pain. Hx of blood clots, not on any anticoagulation. Has had intermittent bloody stools in the past 2 days. Has a PCP. Denies fever, cp, shob, constipation.   No current facility-administered medications for this encounter.  Current Outpatient Medications:  .  benzonatate (TESSALON) 100 MG capsule, Take 1-2 capsules (100-200 mg total) by mouth 3 (three) times daily as needed for cough., Disp: 60 capsule, Rfl: 0 .  cetirizine (ZYRTEC ALLERGY) 10 MG tablet, Take 1 tablet (10 mg total) by mouth daily., Disp: 30 tablet, Rfl: 0 .  glucose blood test strip, Use as instructed, Disp: 100 each, Rfl: 12 .  metFORMIN (GLUCOPHAGE) 500 MG tablet, Take 1 tablet (500 mg total) by mouth 2 (two) times daily with a meal. (Patient taking differently: Take 750 mg by mouth 2 (two) times daily with a meal. ), Disp: 60 tablet, Rfl: 1 .  ondansetron (ZOFRAN) 4 MG tablet, Take 1 tablet (4 mg total) by mouth every 6 (six) hours., Disp: 12 tablet, Rfl: 0 .  promethazine-dextromethorphan (PROMETHAZINE-DM) 6.25-15 MG/5ML syrup, Take 5 mLs by mouth at bedtime as needed for cough., Disp: 100 mL, Rfl: 0 .  pseudoephedrine (SUDAFED) 30 MG tablet, Take 1 tablet (30 mg total) by mouth every 8 (eight) hours as needed for congestion., Disp: 30 tablet, Rfl: 0   No Known Allergies  Past Medical History:  Diagnosis Date  . Diabetes mellitus without complication ([facility])   . Hypertension   . Kidney stone      History reviewed. No pertinent surgical history.  Family History  Problem Relation Age of Onset  . Diabetes Mother   . Diabetes Father     Social History   Tobacco Use  . Smoking status: Light Tobacco Smoker  . Smokeless tobacco: Never Used  Substance Use Topics  . Alcohol use: No  . Drug use: No     ROS   Objective:   Vitals: BP (!) 149/91 (BP Location: Left Arm)   Pulse 93   Temp 99.3 F (37.4 C) (Oral)   Resp 18   SpO2 98%   Physical Exam Constitutional:      General: He is not in acute distress.    Appearance: Normal appearance. He is well-developed. He is obese. He is not ill-appearing, toxic-appearing or diaphoretic.  HENT:     Head: Normocephalic and atraumatic.     Right Ear: External ear normal.     Left Ear: External ear normal.     Nose: Nose normal.     Mouth/Throat:     Pharynx: Oropharynx is clear.  Eyes:     General: No scleral icterus.       Right eye: No discharge.        Left eye: No discharge.     Extraocular Movements: Extraocular movements intact.     Pupils: Pupils are equal, round, and reactive to light.  Cardiovascular:     Rate and Rhythm: Normal rate.  Pulmonary:     Effort: Pulmonary effort is normal.  Genitourinary:    Rectum: Guaiac result negative. No mass, tenderness, anal fissure or external hemorrhoid.  Musculoskeletal:        General: Swelling (1+ right leg) and tenderness (+ Homan sign) present.     Cervical back: Normal range of motion.  Skin:  General: Skin is warm and dry.  Neurological:     Mental Status: He is alert and oriented to person, place, and time.  Psychiatric:        Mood and Affect: Mood normal.        Behavior: Behavior normal.        Thought Content: Thought content normal.        Judgment: Judgment normal.      Assessment and Plan :   PDMP not reviewed this encounter.  1. Left leg pain   2. Pain and swelling of left lower leg   3. History of DVT of lower extremity   4. Bloody stools     U/S pending, will start Xarelto. F/u with PCP asap. Consider referral to GI specialist for further work up including colonoscopy. Counseled patient on potential for adverse effects with medications prescribed/recommended today, ER and return-to-clinic precautions discussed, patient verbalized  understanding.    Wallis Bamberg, [place] 12/19/19 1554

## 2019-12-24 ENCOUNTER — Other Ambulatory Visit: Payer: Self-pay | Admitting: Internal Medicine

## 2019-12-24 DIAGNOSIS — R1030 Lower abdominal pain, unspecified: Secondary | ICD-10-CM

## 2019-12-24 DIAGNOSIS — K625 Hemorrhage of anus and rectum: Secondary | ICD-10-CM

## 2020-01-04 ENCOUNTER — Ambulatory Visit
Admission: RE | Admit: 2020-01-04 | Discharge: 2020-01-04 | Disposition: A | Discharge status: Home / Self Care | Payer: Managed Care, Other (non HMO) | Source: Ambulatory Visit | Attending: Internal Medicine | Admitting: Internal Medicine

## 2020-01-04 DIAGNOSIS — R1030 Lower abdominal pain, unspecified: Secondary | ICD-10-CM

## 2020-01-04 DIAGNOSIS — K625 Hemorrhage of anus and rectum: Secondary | ICD-10-CM

## 2020-01-04 MED ORDER — IOPAMIDOL (ISOVUE-300) INJECTION 61%
100.0000 mL | Freq: Once | INTRAVENOUS | Status: AC | PRN
  Administered 2020-01-04: 100 mL via INTRAVENOUS

## 2020-02-16 ENCOUNTER — Emergency Department (HOSPITAL_COMMUNITY): Payer: Managed Care, Other (non HMO)

## 2020-02-16 ENCOUNTER — Inpatient Hospital Stay (HOSPITAL_COMMUNITY): Payer: Managed Care, Other (non HMO)

## 2020-02-16 ENCOUNTER — Other Ambulatory Visit: Payer: Self-pay

## 2020-02-16 ENCOUNTER — Inpatient Hospital Stay (HOSPITAL_COMMUNITY)
Admission: EM | Admit: 2020-02-16 | Discharge: 2020-02-19 | DRG: 853 | Disposition: A | Discharge status: Home / Self Care | Payer: Managed Care, Other (non HMO) | Attending: Family Medicine | Admitting: Family Medicine

## 2020-02-16 ENCOUNTER — Encounter (HOSPITAL_COMMUNITY): Payer: Self-pay | Admitting: Emergency Medicine

## 2020-02-16 DIAGNOSIS — Z91128 Patient's intentional underdosing of medication regimen for other reason: Secondary | ICD-10-CM

## 2020-02-16 DIAGNOSIS — Z79899 Other long term (current) drug therapy: Secondary | ICD-10-CM

## 2020-02-16 DIAGNOSIS — M726 Necrotizing fasciitis: Secondary | ICD-10-CM | POA: Diagnosis present

## 2020-02-16 DIAGNOSIS — L02214 Cutaneous abscess of groin: Secondary | ICD-10-CM | POA: Diagnosis present

## 2020-02-16 DIAGNOSIS — T465X6A Underdosing of other antihypertensive drugs, initial encounter: Secondary | ICD-10-CM | POA: Diagnosis present

## 2020-02-16 DIAGNOSIS — Z7984 Long term (current) use of oral hypoglycemic drugs: Secondary | ICD-10-CM

## 2020-02-16 DIAGNOSIS — E871 Hypo-osmolality and hyponatremia: Secondary | ICD-10-CM | POA: Diagnosis present

## 2020-02-16 DIAGNOSIS — L0390 Cellulitis, unspecified: Secondary | ICD-10-CM | POA: Diagnosis not present

## 2020-02-16 DIAGNOSIS — A401 Sepsis due to streptococcus, group B: Secondary | ICD-10-CM | POA: Diagnosis present

## 2020-02-16 DIAGNOSIS — L03314 Cellulitis of groin: Secondary | ICD-10-CM | POA: Diagnosis present

## 2020-02-16 DIAGNOSIS — Z833 Family history of diabetes mellitus: Secondary | ICD-10-CM | POA: Diagnosis not present

## 2020-02-16 DIAGNOSIS — L03116 Cellulitis of left lower limb: Secondary | ICD-10-CM | POA: Diagnosis present

## 2020-02-16 DIAGNOSIS — Z87891 Personal history of nicotine dependence: Secondary | ICD-10-CM | POA: Diagnosis not present

## 2020-02-16 DIAGNOSIS — I82452 Acute embolism and thrombosis of left peroneal vein: Secondary | ICD-10-CM | POA: Diagnosis not present

## 2020-02-16 DIAGNOSIS — Z7901 Long term (current) use of anticoagulants: Secondary | ICD-10-CM

## 2020-02-16 DIAGNOSIS — L02416 Cutaneous abscess of left lower limb: Secondary | ICD-10-CM

## 2020-02-16 DIAGNOSIS — I10 Essential (primary) hypertension: Secondary | ICD-10-CM | POA: Diagnosis present

## 2020-02-16 DIAGNOSIS — A419 Sepsis, unspecified organism: Secondary | ICD-10-CM | POA: Diagnosis present

## 2020-02-16 DIAGNOSIS — R739 Hyperglycemia, unspecified: Secondary | ICD-10-CM

## 2020-02-16 DIAGNOSIS — Z794 Long term (current) use of insulin: Secondary | ICD-10-CM | POA: Diagnosis not present

## 2020-02-16 DIAGNOSIS — E872 Acidosis, unspecified: Secondary | ICD-10-CM | POA: Diagnosis present

## 2020-02-16 DIAGNOSIS — Z87442 Personal history of urinary calculi: Secondary | ICD-10-CM

## 2020-02-16 DIAGNOSIS — Z20822 Contact with and (suspected) exposure to covid-19: Secondary | ICD-10-CM | POA: Diagnosis present

## 2020-02-16 DIAGNOSIS — E1165 Type 2 diabetes mellitus with hyperglycemia: Secondary | ICD-10-CM

## 2020-02-16 DIAGNOSIS — K529 Noninfective gastroenteritis and colitis, unspecified: Secondary | ICD-10-CM | POA: Diagnosis not present

## 2020-02-16 DIAGNOSIS — E669 Obesity, unspecified: Secondary | ICD-10-CM | POA: Diagnosis present

## 2020-02-16 DIAGNOSIS — Z23 Encounter for immunization: Secondary | ICD-10-CM

## 2020-02-16 DIAGNOSIS — Z86718 Personal history of other venous thrombosis and embolism: Secondary | ICD-10-CM

## 2020-02-16 DIAGNOSIS — Z8719 Personal history of other diseases of the digestive system: Secondary | ICD-10-CM | POA: Diagnosis not present

## 2020-02-16 DIAGNOSIS — Z6835 Body mass index (BMI) 35.0-35.9, adult: Secondary | ICD-10-CM

## 2020-02-16 LAB — RESP PANEL BY RT-PCR (FLU A&B, COVID) ARPGX2
Influenza A by PCR: NEGATIVE
Influenza B by PCR: NEGATIVE
SARS Coronavirus 2 by RT PCR: NEGATIVE

## 2020-02-16 LAB — C-REACTIVE PROTEIN: CRP: 19.8 mg/dL — ABNORMAL HIGH (ref <1.0)

## 2020-02-16 LAB — CBC WITH DIFFERENTIAL/PLATELET
Abs Immature Granulocytes: 0.17 10*3/uL — ABNORMAL HIGH (ref 0.00–0.07)
Basophils Absolute: 0 10*3/uL (ref 0.0–0.1)
Basophils Relative: 0 %
Eosinophils Absolute: 0 10*3/uL (ref 0.0–0.5)
Eosinophils Relative: 0 %
HCT: 47.3 % (ref 39.0–52.0)
Hemoglobin: 15.6 g/dL (ref 13.0–17.0)
Immature Granulocytes: 1 %
Lymphocytes Relative: 13 %
Lymphs Abs: 2.6 10*3/uL (ref 0.7–4.0)
MCH: 28.2 pg (ref 26.0–34.0)
MCHC: 33 g/dL (ref 30.0–36.0)
MCV: 85.4 fL (ref 80.0–100.0)
Monocytes Absolute: 2.3 10*3/uL — ABNORMAL HIGH (ref 0.1–1.0)
Monocytes Relative: 11 %
Neutro Abs: 15.5 10*3/uL — ABNORMAL HIGH (ref 1.7–7.7)
Neutrophils Relative %: 75 %
Platelets: 202 10*3/uL (ref 150–400)
RBC: 5.54 MIL/uL (ref 4.22–5.81)
RDW: 14.1 % (ref 11.5–15.5)
WBC: 20.5 10*3/uL — ABNORMAL HIGH (ref 4.0–10.5)
nRBC: 0 % (ref 0.0–0.2)

## 2020-02-16 LAB — COMPREHENSIVE METABOLIC PANEL
ALT: 14 U/L (ref 0–44)
AST: 15 U/L (ref 15–41)
Albumin: 3.2 g/dL — ABNORMAL LOW (ref 3.5–5.0)
Alkaline Phosphatase: 110 U/L (ref 38–126)
Anion gap: 13 (ref 5–15)
BUN: 15 mg/dL (ref 6–20)
CO2: 24 mmol/L (ref 22–32)
Calcium: 9.3 mg/dL (ref 8.9–10.3)
Chloride: 93 mmol/L — ABNORMAL LOW (ref 98–111)
Creatinine, Ser: 1.12 mg/dL (ref 0.61–1.24)
GFR, Estimated: >60 mL/min (ref >60)
Glucose, Bld: 386 mg/dL — ABNORMAL HIGH (ref 70–99)
Potassium: 4.8 mmol/L (ref 3.5–5.1)
Sodium: 130 mmol/L — ABNORMAL LOW (ref 135–145)
Total Bilirubin: 2.2 mg/dL — ABNORMAL HIGH (ref 0.3–1.2)
Total Protein: 8.1 g/dL (ref 6.5–8.1)

## 2020-02-16 LAB — HEMOGLOBIN A1C
Hgb A1c MFr Bld: 12.5 % — ABNORMAL HIGH (ref 4.8–5.6)
Mean Plasma Glucose: 312.05 mg/dL

## 2020-02-16 LAB — PROCALCITONIN: Procalcitonin: 0.29 ng/mL

## 2020-02-16 LAB — LACTIC ACID, PLASMA
Lactic Acid, Venous: 2.8 mmol/L (ref 0.5–1.9)
Lactic Acid, Venous: 2.3 mmol/L (ref 0.5–1.9)

## 2020-02-16 LAB — CBG MONITORING, ED
Glucose-Capillary: 372 mg/dL — ABNORMAL HIGH (ref 70–99)
Glucose-Capillary: 269 mg/dL — ABNORMAL HIGH (ref 70–99)

## 2020-02-16 MED ORDER — LACTATED RINGERS IV BOLUS (SEPSIS): 1000.0000 mL | Freq: Once | INTRAVENOUS | Status: DC

## 2020-02-16 MED ORDER — PREDNISONE 10 MG PO TABS
10.0000 mg | ORAL_TABLET | Freq: Every day | ORAL | Status: DC
  Administered 2020-02-17 – 2020-02-19 (×3): 10 mg via ORAL
  Filled 2020-02-16 (×3): qty 1

## 2020-02-16 MED ORDER — VANCOMYCIN HCL 2000 MG/400ML IV SOLN
2000.0000 mg | Freq: Once | INTRAVENOUS | Status: AC
  Administered 2020-02-16: 2000 mg via INTRAVENOUS
  Filled 2020-02-16: qty 400

## 2020-02-16 MED ORDER — SODIUM CHLORIDE 0.9 % IV SOLN
2.0000 g | Freq: Three times a day (TID) | INTRAVENOUS | Status: DC
  Administered 2020-02-16: 2 g via INTRAVENOUS
  Filled 2020-02-16 (×3): qty 2

## 2020-02-16 MED ORDER — SODIUM CHLORIDE 0.9 % IV SOLN
2.0000 g | INTRAVENOUS | Status: DC
  Administered 2020-02-16: 2 g via INTRAVENOUS
  Filled 2020-02-16: qty 20

## 2020-02-16 MED ORDER — POLYETHYLENE GLYCOL 3350 17 G PO PACK: 17.0000 g | PACK | Freq: Every day | ORAL | Status: DC | PRN

## 2020-02-16 MED ORDER — OXYCODONE-ACETAMINOPHEN 5-325 MG PO TABS
1.0000 | ORAL_TABLET | ORAL | Status: DC | PRN
  Administered 2020-02-17 – 2020-02-19 (×6): 1 via ORAL
  Filled 2020-02-16 (×6): qty 1

## 2020-02-16 MED ORDER — RIVAROXABAN 20 MG PO TABS
20.0000 mg | ORAL_TABLET | Freq: Every day | ORAL | Status: DC
  Administered 2020-02-17 – 2020-02-18 (×3): 20 mg via ORAL
  Filled 2020-02-16 (×3): qty 1

## 2020-02-16 MED ORDER — INSULIN GLARGINE 100 UNIT/ML ~~LOC~~ SOLN
20.0000 [IU] | Freq: Every day | SUBCUTANEOUS | Status: DC
  Filled 2020-02-16 (×2): qty 0.2

## 2020-02-16 MED ORDER — SODIUM CHLORIDE 0.9 % IV SOLN: INTRAVENOUS | Status: AC

## 2020-02-16 MED ORDER — LACTATED RINGERS IV SOLN: INTRAVENOUS | Status: DC

## 2020-02-16 MED ORDER — LACTATED RINGERS IV BOLUS (SEPSIS)
500.0000 mL | Freq: Once | INTRAVENOUS | Status: AC
  Administered 2020-02-16: 500 mL via INTRAVENOUS

## 2020-02-16 MED ORDER — ONDANSETRON HCL 4 MG PO TABS: 4.0000 mg | ORAL_TABLET | Freq: Four times a day (QID) | ORAL | Status: DC | PRN

## 2020-02-16 MED ORDER — SODIUM CHLORIDE 0.9 % IV BOLUS
2000.0000 mL | Freq: Once | INTRAVENOUS | Status: AC
  Administered 2020-02-16: 2000 mL via INTRAVENOUS

## 2020-02-16 MED ORDER — SODIUM CHLORIDE 0.9 % IV BOLUS
1000.0000 mL | Freq: Once | INTRAVENOUS | Status: AC
  Administered 2020-02-16: 1000 mL via INTRAVENOUS

## 2020-02-16 MED ORDER — ACETAMINOPHEN 650 MG RE SUPP: 650.0000 mg | Freq: Four times a day (QID) | RECTAL | Status: DC | PRN

## 2020-02-16 MED ORDER — INSULIN ASPART 100 UNIT/ML ~~LOC~~ SOLN
0.0000 [IU] | Freq: Three times a day (TID) | SUBCUTANEOUS | Status: DC
  Administered 2020-02-17: 11 [IU] via SUBCUTANEOUS
  Administered 2020-02-17: 7 [IU] via SUBCUTANEOUS
  Filled 2020-02-16: qty 1

## 2020-02-16 MED ORDER — ACETAMINOPHEN 325 MG PO TABS
650.0000 mg | ORAL_TABLET | Freq: Four times a day (QID) | ORAL | Status: DC | PRN
  Administered 2020-02-16 – 2020-02-18 (×3): 650 mg via ORAL
  Filled 2020-02-16 (×3): qty 2

## 2020-02-16 MED ORDER — ONDANSETRON HCL 4 MG/2ML IJ SOLN: 4.0000 mg | Freq: Four times a day (QID) | INTRAMUSCULAR | Status: DC | PRN

## 2020-02-16 NOTE — ED Notes (Signed)
Blood cultures obtained by IV team RN 

## 2020-02-16 NOTE — H&P (Signed)
History and Physical    JACKSEN ISIP SHF:[id number] DOB: [birth year] DOA: 02/16/2020  PCP: Renford Dills, MD  Patient coming from: Home   Chief Complaint:  Chief Complaint  Patient presents with  . Weakness  . Abscess     HPI:    44 year old male with past medical history of diabetes mellitus type 2, multiple DVTs (LLE DVT in 2015, RLE DVT in 11/2019), hypertension, obesity who presents to [facility] emergency department with complaints of generalized weakness and left groin pain.  Patient explains that last weekend he developed a red "raw" bump on the medial left thigh near the groin.  Patient explains that in the days that followed, this area of redness proceeded to enlarge and became increasingly painful.  Pain is sharp in quality, worse with weightbearing or movement of the affected extremity and 8 out of 10 in intensity.  By following Thursday, the patient began to develop severe generalized weakness and lethargy.  Patient states that in response he has been sleeping nearly constantly.  Patient states he has no appetite and is too ill to take his medications including his insulin.  Patient symptoms continued to worsen in the days that followed eventually prompting the patient to present to [facility] emergency department for evaluation.  Of note, patient states that he was recently placed on a 4-week course of prednisone therapy in late October after patient underwent a colonoscopy revealing findings concerning for inflammatory bowel disease, possibly ulcerative colitis according to the patient.  The procedure was performed by [facility] and unfortunately I cannot review his medical records.  Patient is still taking 10 mg of prednisone daily from this regimen.  Upon evaluation in the emergency department patient has been found to have substantial leukocytosis with tachycardia and evidence of substantial left medial thigh cellulitis with lactic acidosis of 2.8 and  concern for concurrent early sepsis.  Patient has been initiated on intravenous ceftriaxone and has been initiated on intravenous fluids.  The hospitalist group is now been called to assess the patient for admission to the hospital.  Review of Systems:   ROS  Past Medical History:  Diagnosis Date  . COVID-19 02/2019  . Diabetes mellitus without complication (HCC)   . Hypertension   . Kidney stone     History reviewed. No pertinent surgical history.   reports that he has quit smoking. He has never used smokeless tobacco. He reports that he does not drink alcohol and does not use drugs.  No Known Allergies  Family History  Problem Relation Age of Onset  . Diabetes Mother   . Diabetes Father      Prior to Admission medications   Medication Sig Start Date End Date Taking? Authorizing Provider  benzonatate (TESSALON) 100 MG capsule Take 1-2 capsules (100-200 mg total) by mouth 3 (three) times daily as needed for cough. 11/11/19   Wallis Bamberg, PA-C  cetirizine (ZYRTEC ALLERGY) 10 MG tablet Take 1 tablet (10 mg total) by mouth daily. 11/11/19   Wallis Bamberg, PA-C  glucose blood test strip Use as instructed 06/22/18   Henderly, Britni A, PA-C  metFORMIN (GLUCOPHAGE) 500 MG tablet Take 1 tablet (500 mg total) by mouth 2 (two) times daily with a meal. Patient taking differently: Take 750 mg by mouth 2 (two) times daily with a meal.  06/09/17   Mardella Layman, MD  ondansetron (ZOFRAN) 4 MG tablet Take 1 tablet (4 mg total) by mouth every 6 (six) hours. 06/19/18   Hedges,  Tinnie Gens, PA-C  promethazine-dextromethorphan (PROMETHAZINE-DM) 6.25-15 MG/5ML syrup Take 5 mLs by mouth at bedtime as needed for cough. 11/11/19   Wallis Bamberg, PA-C  pseudoephedrine (SUDAFED) 30 MG tablet Take 1 tablet (30 mg total) by mouth every 8 (eight) hours as needed for congestion. 11/11/19   Wallis Bamberg, PA-C  RIVAROXABAN Carlena Hurl) VTE STARTER PACK (15 & 20 MG TABLETS) Follow package directions: Take one 15mg  tablet by mouth  twice a day. On day 22, switch to one 20mg  tablet once a day. Take with food. 12/19/19   , PA-C  albuterol (PROVENTIL HFA;VENTOLIN HFA) 108 (90 Base) MCG/ACT inhaler Inhale 2 puffs into the lungs every 4 (four) hours as needed for wheezing or shortness of breath. 03/24/16 03/14/19  05/22/16, MD    Physical Exam: Vitals:   02/16/20 2030 02/16/20 2033 02/16/20 2045 02/16/20 2100  BP: 138/81  131/84 131/90  Pulse: (!) 109 (!) 109 (!) 111 (!) 111  Resp: 20 (!) 25 (!) 25 16  Temp:      SpO2: 100% 100% 100% 100%  Weight:      Height:        Constitutional: Lethargic but arousable and oriented x3.  Patient is in mild distress due to left thigh discomfort Skin: Extensive area of hyperemic and indurated skin noted over the left medial thigh extending up into the groin.  No evidence of bulla or crepitus to suggest necrotizing fasciitis.  No obvious areas of fluctuance.  No rashes, no lesions, notably poor skin turgor. Eyes: Pupils are equally reactive to light.  No evidence of scleral icterus or conjunctival pallor.  ENMT: Dry mucous membranes noted.  Posterior pharynx clear of any exudate or lesions.   Neck: normal, supple, no masses, no thyromegaly.  No evidence of jugular venous distension.   Respiratory: clear to auscultation bilaterally, no wheezing, no crackles. Normal respiratory effort. No accessory muscle use.  Cardiovascular: Tachycardic but regular, no murmurs / rubs / gallops. No extremity edema. 2+ pedal pulses. No carotid bruits.  Chest:   Nontender without crepitus or deformity.   Back:   Nontender without crepitus or deformity. Abdomen: Generalized abdominal tenderness.  Abdomen is soft however.  No evidence of intra-abdominal masses.  Positive bowel sounds noted in all quadrants.   Musculoskeletal: Significant area of redness induration tenderness and edema as mentioned in the skin examination.  Notable pain with range of motion of the left hip.  Otherwise, no  contractures. Normal muscle tone.  Neurologic: CN 2-12 grossly intact. Sensation intact.  Patient moving all 4 extremities spontaneously.  Patient is following all commands.  Patient is responsive to verbal stimuli.   Psychiatric: Patient exhibits normal mood with flat affect.  Patient seems to possess insight as to their current situation.     Labs on Admission: I have personally reviewed following labs and imaging studies -   CBC: Recent Labs  Lab 02/16/20 1752  WBC 20.5*  NEUTROABS 15.5*  HGB 15.6  HCT 47.3  MCV 85.4  PLT 202   Basic Metabolic Panel: Recent Labs  Lab 02/16/20 1752  NA 130*  K 4.8  CL 93*  CO2 24  GLUCOSE 386*  BUN 15  CREATININE 1.12  CALCIUM 9.3   GFR: Estimated Creatinine Clearance: 111.2 mL/min (by C-G formula based on SCr of 1.12 mg/dL). Liver Function Tests: Recent Labs  Lab 02/16/20 1752  AST 15  ALT 14  ALKPHOS 110  BILITOT 2.2*  PROT 8.1  ALBUMIN 3.2*   No  results for input(s): LIPASE, AMYLASE in the last 168 hours. No results for input(s): AMMONIA in the last 168 hours. Coagulation Profile: No results for input(s): INR, PROTIME in the last 168 hours. Cardiac Enzymes: No results for input(s): CKTOTAL, CKMB, CKMBINDEX, TROPONINI in the last 168 hours. BNP (last 3 results) No results for input(s): PROBNP in the last 8760 hours. HbA1C: No results for input(s): HGBA1C in the last 72 hours. CBG: Recent Labs  Lab 02/16/20 1709  GLUCAP 372*   Lipid Profile: No results for input(s): CHOL, HDL, LDLCALC, TRIG, CHOLHDL, LDLDIRECT in the last 72 hours. Thyroid Function Tests: No results for input(s): TSH, T4TOTAL, FREET4, T3FREE, THYROIDAB in the last 72 hours. Anemia Panel: No results for input(s): VITAMINB12, FOLATE, FERRITIN, TIBC, IRON, RETICCTPCT in the last 72 hours. Urine analysis:    Component Value Date/Time   COLORURINE YELLOW 06/22/2018 1320   APPEARANCEUR CLEAR 06/22/2018 1320   LABSPEC 1.029 06/22/2018 1320   PHURINE  5.0 06/22/2018 1320   GLUCOSEU NEGATIVE 06/22/2018 1320   HGBUR NEGATIVE 06/22/2018 1320   BILIRUBINUR NEGATIVE 06/22/2018 1320   KETONESUR NEGATIVE 06/22/2018 1320   PROTEINUR 30 (A) 06/22/2018 1320   UROBILINOGEN 0.2 09/30/2012 1937   NITRITE NEGATIVE 06/22/2018 1320   LEUKOCYTESUR NEGATIVE 06/22/2018 1320    Radiological Exams on Admission - Personally Reviewed: DG Chest Portable 1 View  Result Date: 02/16/2020 CLINICAL DATA:  Shortness of breath. EXAM: PORTABLE CHEST 1 VIEW COMPARISON:  March 24, 2016 FINDINGS: The heart size and mediastinal contours are within normal limits. Both lungs are clear. The visualized skeletal structures are unremarkable. IMPRESSION: No active disease. Electronically Signed   By: Aram Candelahaddeus  Houston M.D.   On: 02/16/2020 18:01    EKG: Personally reviewed.  Rhythm is sinus tachycardia with heart rate of 114 bpm.  No dynamic ST segment changes appreciated.  Assessment/Plan Principal Problem:   Sepsis due to cellulitis [facility](HCC)   Patient is exhibiting multiple SIRS criteria including lactic acidosis concerning for early sepsis due to left groin cellulitis  Patient developed skin breakdown in the left groin approximately 1 week ago which rapidly progressed to severe infection due to presence of diabetes with superimposed ongoing use of prednisone for suspected inflammatory bowel disease  High risk of rapid clinical decompensation  No obvious clinical evidence of necrotizing fasciitis at this time although this area will need to be reassessed regularly  Obtaining left thigh and groin ultrasound to ensure there is no underlying fluid collection/abscess  Obtaining additional ultrasound to evaluate for any evidence of large DVT suggestive of cerulea dolens during recent diagnosis of DVT in September  Hydrating patient aggressively with intravenous isotonic fluids, 30 cc/kg  Initially placing patient on aggressive broad-spectrum intravenous antibiotic therapy  with cefepime and vancomycin  Blood cultures have been obtained  Active Problems:   Lactic acidosis   Aggressive intravenous volume resuscitation with isotonic fluids  Lactic acidosis likely secondary to underlying sepsis  Performing serial lactic acid levels to ensure downtrending and resolution    Cellulitis of left lower extremity   Please see assessment and plan above    Acute deep vein thrombosis (DVT) of left peroneal vein (HCC)   Left peroneal DVT identified September 2021  This was likely caused by significant inflammation/possible inflammatory bowel disease coming from the rectum   Because of the presence of possible inflammatory bowel disease and ongoing hypercoagulable state obtaining repeat ultrasound of the left lower extremity to ensure these new physical exam findings are not secondary to a  developing cerulea dolens  Patient has remote history of right lower extremity DVT diagnosed in 2015    Uncontrolled type 2 diabetes mellitus with hyperglycemia, with long-term current use of insulin (HCC)   Patient currently quite hyperglycemic, likely exacerbated by ongoing steroid use  Hemoglobin A1c pending  Accu-Cheks before every meal and nightly with sliding scale insulin  Placing patient on reduced regimen of Lantus daily due to tenuous oral intake    Essential hypertension   Holding home regimen of oral antihypertensives due to low normal blood pressures in the emergency department spite not taking antihypertensives for 3 days    Inflammatory bowel disease  Patient underwent CT abdomen pelvis in October due to bloody stools since early September.  CT abdomen pelvis in October was concerning for rectal malignancy however according to the patient (records not available) [facility] performed colonoscopy in late October that was more suggestive of inflammatory bowel disease   Patient continues to be on a slow taper of oral prednisone, currently 10 mg daily  which will be continued while here   Patient is to follow-up with [facility] early this coming week for repeat assessment and possible repeat colonoscopy according to the patient.    Patient exhibiting generalized tenderness on examination however abdomen is soft.  If this persists or worsens, will consider repeat CT imaging of the abdomen during this hospitalization.   Code Status:  Full code Family Communication: Deferred  Status is: Inpatient  Remains inpatient appropriate because:Ongoing diagnostic testing needed not appropriate for outpatient work up, Unsafe d/c plan, IV treatments appropriate due to intensity of illness or inability to take PO and Inpatient level of care appropriate due to severity of illness   Dispo: The patient is from: Home              Anticipated d/c is to: Home              Anticipated d/c date is: > 3 days              Patient currently is not medically stable to d/c.        Marinda Elk MD [facility] Pager [phone]  If 7PM-7AM, please contact night-coverage [email or web address] Use universal [place] password for that web site. If you do not have the password, please call the hospital operator.  02/16/2020, 9:52 PM

## 2020-02-16 NOTE — ED Notes (Addendum)
Notified Sofia PA of lactic 2.8

## 2020-02-16 NOTE — ED Provider Notes (Signed)
[facility] Provider Note   CSN: [id number] Arrival date & time: 02/16/20  1642     History Chief Complaint  Patient presents with  . Weakness  . Abscess    [facility] is a 44 y.o. male.  The history is provided by the patient. No language interpreter was used.  Weakness Severity:  Severe Onset quality:  Gradual Duration:  4 days Timing:  Constant Progression:  Worsening Chronicity:  New Context: recent infection   Relieved by:  Nothing Worsened by:  Nothing Ineffective treatments:  None tried Associated symptoms: no fever   Risk factors: no new medications   Abscess Associated symptoms: no fever   Pt reports he has not felt like eating or drinking.  Pt complains of a skin infection to left leg.      Past Medical History:  Diagnosis Date  . COVID-19 02/2019  . Diabetes mellitus without complication (HCC)   . Hypertension   . Kidney stone     There are no problems to display for this patient.   History reviewed. No pertinent surgical history.     Family History  Problem Relation Age of Onset  . Diabetes Mother   . Diabetes Father     Social History   Tobacco Use  . Smoking status: Light Tobacco Smoker  . Smokeless tobacco: Never Used  Substance Use Topics  . Alcohol use: No  . Drug use: No    Home Medications Prior to Admission medications   Medication Sig Start Date End Date Taking? Authorizing Provider  benzonatate (TESSALON) 100 MG capsule Take 1-2 capsules (100-200 mg total) by mouth 3 (three) times daily as needed for cough. 11/11/19   Wallis Bamberg, PA-C  cetirizine (ZYRTEC ALLERGY) 10 MG tablet Take 1 tablet (10 mg total) by mouth daily. 11/11/19   Wallis Bamberg, PA-C  glucose blood test strip Use as instructed 06/22/18   Henderly, Britni A, PA-C  metFORMIN (GLUCOPHAGE) 500 MG tablet Take 1 tablet (500 mg total) by mouth 2 (two) times daily with a meal. Patient taking differently: Take 750 mg by  mouth 2 (two) times daily with a meal.  06/09/17   Mardella Layman, MD  ondansetron (ZOFRAN) 4 MG tablet Take 1 tablet (4 mg total) by mouth every 6 (six) hours. 06/19/18   Hedges, Tinnie Gens, PA-C  promethazine-dextromethorphan (PROMETHAZINE-DM) 6.25-15 MG/5ML syrup Take 5 mLs by mouth at bedtime as needed for cough. 11/11/19   Wallis Bamberg, PA-C  pseudoephedrine (SUDAFED) 30 MG tablet Take 1 tablet (30 mg total) by mouth every 8 (eight) hours as needed for congestion. 11/11/19   Wallis Bamberg, PA-C  RIVAROXABAN Carlena Hurl) VTE STARTER PACK (15 & 20 MG TABLETS) Follow package directions: Take one 15mg  tablet by mouth twice a day. On day 22, switch to one 20mg  tablet once a day. Take with food. 12/19/19   , PA-C  albuterol (PROVENTIL HFA;VENTOLIN HFA) 108 (90 Base) MCG/ACT inhaler Inhale 2 puffs into the lungs every 4 (four) hours as needed for wheezing or shortness of breath. 03/24/16 03/14/19  05/22/16, MD    Allergies    Patient has no known allergies.  Review of Systems   Review of Systems  Constitutional: Negative for fever.  Neurological: Positive for weakness.  All other systems reviewed and are negative.   Physical Exam Updated Vital Signs BP 131/77 (BP Location: Left Arm)   Pulse (!) 104   Temp 99.5 F (37.5 C)   Resp 18  Ht 5\' 11"  (1.803 m)   Wt 115.7 kg   SpO2 99%   BMI 35.57 kg/m   Physical Exam Vitals and nursing note reviewed.  Constitutional:      Appearance: He is well-developed.  HENT:     Head: Normocephalic and atraumatic.  Eyes:     Conjunctiva/sclera: Conjunctivae normal.  Cardiovascular:     Rate and Rhythm: Normal rate and regular rhythm.     Heart sounds: No murmur heard.   Pulmonary:     Effort: Pulmonary effort is normal. No respiratory distress.     Breath sounds: Normal breath sounds.  Abdominal:     Palpations: Abdomen is soft.     Tenderness: There is no abdominal tenderness.  Musculoskeletal:     Cervical back: Neck supple.  Skin:     General: Skin is warm and dry.     Findings: Erythema present.     Comments: Erythema left upper leg 30cm area, hot to touch   Neurological:     General: No focal deficit present.     Mental Status: He is alert.  Psychiatric:        Mood and Affect: Mood normal.     ED Results / Procedures / Treatments   Labs (all labs ordered are listed, but only abnormal results are displayed) Labs Reviewed  LACTIC ACID, PLASMA - Abnormal; Notable for the following components:      Result Value   Lactic Acid, Venous 2.8 (*)    All other components within normal limits  COMPREHENSIVE METABOLIC PANEL - Abnormal; Notable for the following components:   Sodium 130 (*)    Chloride 93 (*)    Glucose, Bld 386 (*)    Albumin 3.2 (*)    Total Bilirubin 2.2 (*)    All other components within normal limits  CBC WITH DIFFERENTIAL/PLATELET - Abnormal; Notable for the following components:   WBC 20.5 (*)    Neutro Abs 15.5 (*)    Monocytes Absolute 2.3 (*)    Abs Immature Granulocytes 0.17 (*)    All other components within normal limits  CBG MONITORING, ED - Abnormal; Notable for the following components:   Glucose-Capillary 372 (*)    All other components within normal limits  RESP PANEL BY RT-PCR (FLU A&B, COVID) ARPGX2  CULTURE, BLOOD (SINGLE)  URINALYSIS, ROUTINE W REFLEX MICROSCOPIC  LACTIC ACID, PLASMA  D-DIMER, QUANTITATIVE (NOT AT [facility])    EKG None  Radiology DG Chest Portable 1 View  Result Date: 02/16/2020 CLINICAL DATA:  Shortness of breath. EXAM: PORTABLE CHEST 1 VIEW COMPARISON:  March 24, 2016 FINDINGS: The heart size and mediastinal contours are within normal limits. Both lungs are clear. The visualized skeletal structures are unremarkable. IMPRESSION: No active disease. Electronically Signed   By: March 26, 2016 M.D.   On: 02/16/2020 18:01    Procedures .Critical Care Performed by: 02/18/2020, PA-C Authorized by: Elson Areas, PA-C   Critical care provider  statement:    Critical care time (minutes):  45   Critical care start time:  02/16/2020 5:30 PM   Critical care end time:  02/16/2020 8:49 PM   Critical care was time spent personally by me on the following activities:  Discussions with consultants, evaluation of patient's response to treatment, examination of patient, ordering and performing treatments and interventions, ordering and review of laboratory studies, ordering and review of radiographic studies, pulse oximetry, re-evaluation of patient's condition, obtaining history from patient or surrogate and review of  old charts   (including critical care time)  Medications Ordered in ED Medications  sodium chloride 0.9 % bolus 2,000 mL (has no administration in time range)  lactated ringers infusion (has no administration in time range)  cefTRIAXone (ROCEPHIN) 2 g in sodium chloride 0.9 % 100 mL IVPB (has no administration in time range)  lactated ringers bolus 1,000 mL (has no administration in time range)    And  lactated ringers bolus 1,000 mL (has no administration in time range)    And  lactated ringers bolus 1,000 mL (has no administration in time range)    And  lactated ringers bolus 500 mL (has no administration in time range)    ED Course  I have reviewed the triage vital signs and the nursing notes.  Pertinent labs & imaging results that were available during my care of the patient were reviewed by me and considered in my medical decision making (see chart for details).    MDM Rules/Calculators/A&P                          MDM:  Labs and covid ordered,  Wbc count is 20. Lactic 2.8.  Evolving sepsis order set used. IV fluids and antibiotics ordered.  Hospitalist consulted for admission  Final Clinical Impression(s) / ED Diagnoses Final diagnoses:  Cellulitis of left leg  Hyperglycemia    Rx / DC Orders ED Discharge Orders    None       Osie Cheeks 02/16/20 2050    Pollyann Savoy, MD 02/16/20  [id number]

## 2020-02-16 NOTE — Progress Notes (Signed)
Pharmacy Antibiotic Note  [facility] is a 44 y.o. male admitted on 02/16/2020 with cellulitis.  Pharmacy has been consulted for cefepime and vancomycin dosing.  Plan: Cefepime 2gm IV q8 hours Vancomycin 2gm IV x 1 then 1gm IV q8 hours F/u renal function, cultures and clinical course  Height: 5\' 11"  (180.3 cm) Weight: 115.7 kg (255 lb) IBW/kg (Calculated) : 75.3  Temp (24hrs), Avg:99.5 F (37.5 C), Min:99.5 F (37.5 C), Max:99.5 F (37.5 C)  Recent Labs  Lab 02/16/20 1752  WBC 20.5*  CREATININE 1.12  LATICACIDVEN 2.8*    Estimated Creatinine Clearance: 111.2 mL/min (by C-G formula based on SCr of 1.12 mg/dL).    No Known Allergies   Thank you for allowing pharmacy to be a part of this patient's care.  02/18/20 02/16/2020 10:33 PM

## 2020-02-16 NOTE — Progress Notes (Signed)
Following for Code Sepsis  

## 2020-02-16 NOTE — ED Notes (Signed)
Lab at bedside to obtain 2nd lactic acid

## 2020-02-16 NOTE — ED Triage Notes (Signed)
Pt states he has been sleeping a lot since Thursday.  States he had to make himself eat a sandwich on Friday.  Has not been checking his sugars for the past week.  Denies fever at home.  Reports boil to L groin x 4-5 days.  Also reports generalized weakness and SOB.

## 2020-02-17 ENCOUNTER — Inpatient Hospital Stay (HOSPITAL_COMMUNITY): Payer: Managed Care, Other (non HMO)

## 2020-02-17 ENCOUNTER — Encounter (HOSPITAL_COMMUNITY)
Admission: EM | Disposition: A | Discharge status: Home / Self Care | Payer: Self-pay | Source: Home / Self Care | Attending: Family Medicine

## 2020-02-17 ENCOUNTER — Inpatient Hospital Stay (HOSPITAL_COMMUNITY): Payer: Managed Care, Other (non HMO) | Admitting: Anesthesiology

## 2020-02-17 HISTORY — PX: INCISION AND DRAINAGE ABSCESS: SHX5864

## 2020-02-17 LAB — SURGICAL PCR SCREEN
MRSA, PCR: NEGATIVE
Staphylococcus aureus: NEGATIVE

## 2020-02-17 LAB — COMPREHENSIVE METABOLIC PANEL
ALT: 12 U/L (ref 0–44)
AST: 11 U/L — ABNORMAL LOW (ref 15–41)
Albumin: 2.3 g/dL — ABNORMAL LOW (ref 3.5–5.0)
Alkaline Phosphatase: 77 U/L (ref 38–126)
Anion gap: 12 (ref 5–15)
BUN: 13 mg/dL (ref 6–20)
CO2: 18 mmol/L — ABNORMAL LOW (ref 22–32)
Calcium: 7.7 mg/dL — ABNORMAL LOW (ref 8.9–10.3)
Chloride: 100 mmol/L (ref 98–111)
Creatinine, Ser: 0.98 mg/dL (ref 0.61–1.24)
GFR, Estimated: >60 mL/min (ref >60)
Glucose, Bld: 274 mg/dL — ABNORMAL HIGH (ref 70–99)
Potassium: 3.8 mmol/L (ref 3.5–5.1)
Sodium: 130 mmol/L — ABNORMAL LOW (ref 135–145)
Total Bilirubin: 2.2 mg/dL — ABNORMAL HIGH (ref 0.3–1.2)
Total Protein: 6 g/dL — ABNORMAL LOW (ref 6.5–8.1)

## 2020-02-17 LAB — PROTIME-INR
INR: 1.4 — ABNORMAL HIGH (ref 0.8–1.2)
Prothrombin Time: 16.3 seconds — ABNORMAL HIGH (ref 11.4–15.2)

## 2020-02-17 LAB — CBC WITH DIFFERENTIAL/PLATELET
Abs Immature Granulocytes: 0.16 10*3/uL — ABNORMAL HIGH (ref 0.00–0.07)
Basophils Absolute: 0 10*3/uL (ref 0.0–0.1)
Basophils Relative: 0 %
Eosinophils Absolute: 0 10*3/uL (ref 0.0–0.5)
Eosinophils Relative: 0 %
HCT: 36.9 % — ABNORMAL LOW (ref 39.0–52.0)
Hemoglobin: 12.5 g/dL — ABNORMAL LOW (ref 13.0–17.0)
Immature Granulocytes: 1 %
Lymphocytes Relative: 12 %
Lymphs Abs: 2.3 10*3/uL (ref 0.7–4.0)
MCH: 28.5 pg (ref 26.0–34.0)
MCHC: 33.9 g/dL (ref 30.0–36.0)
MCV: 84.2 fL (ref 80.0–100.0)
Monocytes Absolute: 2.4 10*3/uL — ABNORMAL HIGH (ref 0.1–1.0)
Monocytes Relative: 13 %
Neutro Abs: 14.3 10*3/uL — ABNORMAL HIGH (ref 1.7–7.7)
Neutrophils Relative %: 74 %
Platelets: 185 10*3/uL (ref 150–400)
RBC: 4.38 MIL/uL (ref 4.22–5.81)
RDW: 14 % (ref 11.5–15.5)
WBC: 19.2 10*3/uL — ABNORMAL HIGH (ref 4.0–10.5)
nRBC: 0 % (ref 0.0–0.2)

## 2020-02-17 LAB — PROCALCITONIN: Procalcitonin: 0.33 ng/mL

## 2020-02-17 LAB — HIV ANTIBODY (ROUTINE TESTING W REFLEX): HIV Screen 4th Generation wRfx: NONREACTIVE

## 2020-02-17 LAB — GLUCOSE, CAPILLARY
Glucose-Capillary: 233 mg/dL — ABNORMAL HIGH (ref 70–99)
Glucose-Capillary: 209 mg/dL — ABNORMAL HIGH (ref 70–99)
Glucose-Capillary: 241 mg/dL — ABNORMAL HIGH (ref 70–99)
Glucose-Capillary: 248 mg/dL — ABNORMAL HIGH (ref 70–99)
Glucose-Capillary: 259 mg/dL — ABNORMAL HIGH (ref 70–99)

## 2020-02-17 LAB — LACTIC ACID, PLASMA: Lactic Acid, Venous: 1.3 mmol/L (ref 0.5–1.9)

## 2020-02-17 LAB — CORTISOL-AM, BLOOD: Cortisol - AM: 15.3 ug/dL (ref 6.7–22.6)

## 2020-02-17 SURGERY — INCISION AND DRAINAGE, ABSCESS
Anesthesia: General | Site: Groin | Laterality: Left

## 2020-02-17 MED ORDER — INSULIN ASPART 100 UNIT/ML ~~LOC~~ SOLN
0.0000 [IU] | Freq: Every day | SUBCUTANEOUS | Status: DC
  Administered 2020-02-17: 3 [IU] via SUBCUTANEOUS
  Administered 2020-02-18: 2 [IU] via SUBCUTANEOUS

## 2020-02-17 MED ORDER — FENTANYL CITRATE (PF) 100 MCG/2ML IJ SOLN
25.0000 ug | INTRAMUSCULAR | Status: DC | PRN
  Administered 2020-02-17: 50 ug via INTRAVENOUS

## 2020-02-17 MED ORDER — ACETAMINOPHEN 10 MG/ML IV SOLN: 1000.0000 mg | Freq: Once | INTRAVENOUS | Status: DC | PRN

## 2020-02-17 MED ORDER — INSULIN ASPART 100 UNIT/ML ~~LOC~~ SOLN
0.0000 [IU] | Freq: Three times a day (TID) | SUBCUTANEOUS | Status: DC
  Administered 2020-02-17 – 2020-02-18 (×3): 7 [IU] via SUBCUTANEOUS
  Administered 2020-02-18: 11 [IU] via SUBCUTANEOUS
  Administered 2020-02-18 – 2020-02-19 (×2): 7 [IU] via SUBCUTANEOUS
  Administered 2020-02-19: 11 [IU] via SUBCUTANEOUS

## 2020-02-17 MED ORDER — OXYCODONE HCL 5 MG PO TABS
5.0000 mg | ORAL_TABLET | ORAL | Status: DC | PRN
  Administered 2020-02-17: 10 mg via ORAL
  Filled 2020-02-17: qty 2

## 2020-02-17 MED ORDER — OXYCODONE HCL 5 MG/5ML PO SOLN: 5.0000 mg | Freq: Once | ORAL | Status: DC | PRN

## 2020-02-17 MED ORDER — SUCCINYLCHOLINE CHLORIDE 20 MG/ML IJ SOLN
INTRAMUSCULAR | Status: DC | PRN
  Administered 2020-02-17: 120 mg via INTRAVENOUS

## 2020-02-17 MED ORDER — INSULIN ASPART 100 UNIT/ML ~~LOC~~ SOLN
SUBCUTANEOUS | Status: DC | PRN
  Administered 2020-02-17: 10 [IU] via SUBCUTANEOUS

## 2020-02-17 MED ORDER — ACETAMINOPHEN 160 MG/5ML PO SOLN: 1000.0000 mg | Freq: Once | ORAL | Status: DC | PRN

## 2020-02-17 MED ORDER — INFLUENZA VAC SPLIT QUAD 0.5 ML IM SUSY
0.5000 mL | PREFILLED_SYRINGE | INTRAMUSCULAR | Status: AC
  Administered 2020-02-18: 0.5 mL via INTRAMUSCULAR

## 2020-02-17 MED ORDER — FENTANYL CITRATE (PF) 100 MCG/2ML IJ SOLN
INTRAMUSCULAR | Status: AC
  Filled 2020-02-17: qty 2

## 2020-02-17 MED ORDER — FENTANYL CITRATE (PF) 250 MCG/5ML IJ SOLN
INTRAMUSCULAR | Status: DC | PRN
Start: 2020-02-17 — End: 2020-02-17
  Administered 2020-02-17 (×5): 50 ug via INTRAVENOUS

## 2020-02-17 MED ORDER — LIDOCAINE HCL (CARDIAC) PF 100 MG/5ML IV SOSY
PREFILLED_SYRINGE | INTRAVENOUS | Status: DC | PRN
  Administered 2020-02-17: 60 mg via INTRATRACHEAL

## 2020-02-17 MED ORDER — 0.9 % SODIUM CHLORIDE (POUR BTL) OPTIME
TOPICAL | Status: DC | PRN
  Administered 2020-02-17: 3000 mL

## 2020-02-17 MED ORDER — LACTATED RINGERS IV SOLN: INTRAVENOUS | Status: DC | PRN

## 2020-02-17 MED ORDER — MORPHINE SULFATE (PF) 4 MG/ML IV SOLN: 4.0000 mg | INTRAVENOUS | Status: DC | PRN

## 2020-02-17 MED ORDER — PROPOFOL 10 MG/ML IV BOLUS
INTRAVENOUS | Status: AC
  Filled 2020-02-17: qty 40

## 2020-02-17 MED ORDER — MIDAZOLAM HCL 5 MG/5ML IJ SOLN
INTRAMUSCULAR | Status: DC | PRN
  Administered 2020-02-17: 2 mg via INTRAVENOUS

## 2020-02-17 MED ORDER — SODIUM CHLORIDE 0.9 % IV SOLN
1.0000 g | Freq: Three times a day (TID) | INTRAVENOUS | Status: DC
  Administered 2020-02-17 – 2020-02-19 (×7): 1 g via INTRAVENOUS
  Filled 2020-02-17 (×11): qty 1

## 2020-02-17 MED ORDER — ACETAMINOPHEN 500 MG PO TABS: 1000.0000 mg | ORAL_TABLET | Freq: Once | ORAL | Status: DC | PRN

## 2020-02-17 MED ORDER — MUPIROCIN 2 % EX OINT
1.0000 "application " | TOPICAL_OINTMENT | Freq: Two times a day (BID) | CUTANEOUS | Status: DC
  Administered 2020-02-17 – 2020-02-19 (×5): 1 via NASAL
  Filled 2020-02-17: qty 22

## 2020-02-17 MED ORDER — ONDANSETRON HCL 4 MG/2ML IJ SOLN
INTRAMUSCULAR | Status: DC | PRN
  Administered 2020-02-17: 4 mg via INTRAVENOUS

## 2020-02-17 MED ORDER — FENTANYL CITRATE (PF) 250 MCG/5ML IJ SOLN
INTRAMUSCULAR | Status: AC
  Filled 2020-02-17: qty 5

## 2020-02-17 MED ORDER — VANCOMYCIN HCL IN DEXTROSE 1-5 GM/200ML-% IV SOLN
1000.0000 mg | Freq: Three times a day (TID) | INTRAVENOUS | Status: DC
  Administered 2020-02-17 – 2020-02-19 (×7): 1000 mg via INTRAVENOUS
  Filled 2020-02-17 (×7): qty 200

## 2020-02-17 MED ORDER — IOHEXOL 300 MG/ML  SOLN
100.0000 mL | Freq: Once | INTRAMUSCULAR | Status: AC | PRN
  Administered 2020-02-17: 100 mL via INTRAVENOUS

## 2020-02-17 MED ORDER — MIDAZOLAM HCL 2 MG/2ML IJ SOLN
INTRAMUSCULAR | Status: AC
  Filled 2020-02-17: qty 2

## 2020-02-17 MED ORDER — CLINDAMYCIN PHOSPHATE 600 MG/50ML IV SOLN
600.0000 mg | Freq: Three times a day (TID) | INTRAVENOUS | Status: DC
  Administered 2020-02-17 – 2020-02-19 (×7): 600 mg via INTRAVENOUS
  Filled 2020-02-17 (×10): qty 50

## 2020-02-17 MED ORDER — PROPOFOL 10 MG/ML IV BOLUS
INTRAVENOUS | Status: DC | PRN
  Administered 2020-02-17: 160 mg via INTRAVENOUS

## 2020-02-17 MED ORDER — OXYCODONE HCL 5 MG PO TABS: 5.0000 mg | ORAL_TABLET | Freq: Once | ORAL | Status: DC | PRN

## 2020-02-17 MED ORDER — INSULIN GLARGINE 100 UNIT/ML ~~LOC~~ SOLN
26.0000 [IU] | Freq: Every day | SUBCUTANEOUS | Status: DC
  Administered 2020-02-17 – 2020-02-18 (×2): 26 [IU] via SUBCUTANEOUS
  Filled 2020-02-17 (×2): qty 0.26

## 2020-02-17 MED ORDER — INSULIN ASPART 100 UNIT/ML ~~LOC~~ SOLN
4.0000 [IU] | Freq: Three times a day (TID) | SUBCUTANEOUS | Status: DC
  Administered 2020-02-17 – 2020-02-18 (×4): 4 [IU] via SUBCUTANEOUS

## 2020-02-17 SURGICAL SUPPLY — 38 items
BNDG GAUZE ELAST 4 BULKY (GAUZE/BANDAGES/DRESSINGS) ×2 IMPLANT
BRIEF STRETCH FOR OB PAD XXL (UNDERPADS AND DIAPERS) ×2 IMPLANT
CANISTER SUCT 3000ML PPV (MISCELLANEOUS) ×3 IMPLANT
COVER SURGICAL LIGHT HANDLE (MISCELLANEOUS) ×3 IMPLANT
COVER WAND RF STERILE (DRAPES) ×1 IMPLANT
DRAPE LAPAROSCOPIC ABDOMINAL (DRAPES) ×3 IMPLANT
DRAPE UTILITY XL STRL (DRAPES) ×3 IMPLANT
DRSG PAD ABDOMINAL 8X10 ST (GAUZE/BANDAGES/DRESSINGS) IMPLANT
ELECT CAUTERY BLADE 6.4 (BLADE) ×3 IMPLANT
ELECT REM PT RETURN 9FT ADLT (ELECTROSURGICAL) ×3
ELECTRODE REM PT RTRN 9FT ADLT (ELECTROSURGICAL) ×1 IMPLANT
GAUZE SPONGE 4X4 12PLY STRL (GAUZE/BANDAGES/DRESSINGS) ×2 IMPLANT
GAUZE SPONGE 4X4 12PLY STRL LF (GAUZE/BANDAGES/DRESSINGS) ×2 IMPLANT
GLOVE BIO SURGEON STRL SZ8 (GLOVE) ×3 IMPLANT
GLOVE BIOGEL M STRL SZ7.5 (GLOVE) ×2 IMPLANT
GLOVE BIOGEL PI IND STRL 7.0 (GLOVE) IMPLANT
GLOVE BIOGEL PI IND STRL 7.5 (GLOVE) IMPLANT
GLOVE BIOGEL PI IND STRL 8 (GLOVE) ×1 IMPLANT
GLOVE BIOGEL PI INDICATOR 7.0 (GLOVE) ×2
GLOVE BIOGEL PI INDICATOR 7.5 (GLOVE) ×2
GLOVE BIOGEL PI INDICATOR 8 (GLOVE) ×2
GOWN STRL REUS W/ TWL LRG LVL3 (GOWN DISPOSABLE) ×1 IMPLANT
GOWN STRL REUS W/ TWL XL LVL3 (GOWN DISPOSABLE) ×1 IMPLANT
GOWN STRL REUS W/TWL LRG LVL3 (GOWN DISPOSABLE) ×3
GOWN STRL REUS W/TWL XL LVL3 (GOWN DISPOSABLE) ×3
KIT BASIN OR (CUSTOM PROCEDURE TRAY) ×3 IMPLANT
KIT TURNOVER KIT B (KITS) ×3 IMPLANT
NS IRRIG 1000ML POUR BTL (IV SOLUTION) ×7 IMPLANT
PACK GENERAL/GYN (CUSTOM PROCEDURE TRAY) ×3 IMPLANT
PAD ABD 8X10 STRL (GAUZE/BANDAGES/DRESSINGS) ×2 IMPLANT
PAD ARMBOARD 7.5X6 YLW CONV (MISCELLANEOUS) ×3 IMPLANT
PENCIL SMOKE EVACUATOR (MISCELLANEOUS) ×3 IMPLANT
SOL PREP PROV IODINE SCRUB 4OZ (MISCELLANEOUS) ×2 IMPLANT
SWAB COLLECTION DEVICE MRSA (MISCELLANEOUS) ×2 IMPLANT
SWAB CULTURE ESWAB REG 1ML (MISCELLANEOUS) ×2 IMPLANT
TAPE CLOTH SURG 4X10 WHT LF (GAUZE/BANDAGES/DRESSINGS) ×2 IMPLANT
TOWEL GREEN STERILE (TOWEL DISPOSABLE) ×3 IMPLANT
TOWEL GREEN STERILE FF (TOWEL DISPOSABLE) ×3 IMPLANT

## 2020-02-17 NOTE — Op Note (Signed)
  02/17/2020  4:01 AM  PATIENT:  Noah Clayton  44 y.o. male  PRE-OPERATIVE DIAGNOSIS:  NECROTIZING SUBQ TISSUE INFECTION , LEFT GROIN  POST-OPERATIVE DIAGNOSIS:  NECROTIZING SUBQ TISSUE INFECTION , LEFT GROIN  PROCEDURE:  Procedure(s): Debridement left groin 12 cm x 3 cm x 2 cm deep  SURGEON:  Surgeon(s): Violeta Gelinas, MD  ASSISTANTS: none   ANESTHESIA:   general  EBL:  Total I/O In: 1002.6 [I.V.:502.3; IV Piggyback:500.3] Out: 25 [Blood:25]  BLOOD ADMINISTERED:none  DRAINS: none   SPECIMEN:  Excision  DISPOSITION OF SPECIMEN:  PATHOLOGY  COUNTS:  YES  DICTATION: Reubin Milan Dictation Excisional debridement:  1.  Informed consent was obtained.  His site was marked.  He is on intravenous Merrem and vancomycin.  He was brought emergently to the operating room and general endotracheal anesthesia was administered by the anesthesia staff.  His left groin was prepped and draped in a sterile fashion.  We did a timeout procedure.  I made an incision using cautery over the fluctuant area.  Subcutaneous tissues were dissected down and I entered a pocket of foul pus extending proximally and medially.  Cultures were sent.  I excised the overlying skin over an area of dead necrotic fat.  I debrided the dead necrotic fat using cautery and scissors.  This extended superiorly and medially.  I debrided all visible dead tissue and achieved good hemostasis with the cautery.  I then irrigated with 3 L of saline and packed it with a sterile Kerlix wet-to-dry dressing.  All counts were correct.  A sterile dressing was applied.  He tolerated the procedure well and was taken recovery in stable condition.  2.  Tool used for debridement (curette, scapel, etc.)  Cautery and scissors  3.  Frequency of surgical debridement.   First time  4.  Measurement of total devitalized tissue (wound surface) before and after surgical debridement.   None on surface before, 14x3x2cm after  5.  Area and depth  of devitalized tissue removed from wound. 14x3x2cm  6.  Blood loss and description of tissue removed.  Necrotic fat, 10cc  7.  Evidence of the progress of the wound's response to treatment.  A.  Current wound volume (current dimensions and depth). 14x3x2cm  B.  Presence (and extent of) of infection.  present  C.  Presence (and extent of) of non viable tissue.  excised  D.  Other material in the wound that is expected to inhibit healing. no  8.  Was there any viable tissue removed (measurements): overlying skin  PATIENT DISPOSITION:  PACU - hemodynamically stable.   Delay start of Pharmacological VTE agent (>24hrs) due to surgical blood loss or risk of bleeding:  Yes - has been on Xarelto  Violeta Gelinas, MD, MPH, FACS Pager: [phone]  11/28/20214:01 AM

## 2020-02-17 NOTE — Progress Notes (Signed)
[facility] PROGRESS NOTE    Noah Clayton  MWU:[id number] DOB: [birth year] DOA: 02/16/2020 PCP: Renford Dills, MD      Brief Narrative:  Noah Clayton is a 44 y.o. 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  In the ER, patient tachycardic, tachypneic, febrile WBC 20K.  CT abdomen and pelvis showed a 2x7 likely necrotizing infection in the left anterior groin.  No FOurnier's on clinical exam.  Patient taken emergently to OR for debridement and started on empiric antibiotics.         Assessment & Plan:  Necrotizing fasciitis Sepsis due to necrotizing fasciitis S/p I&D by Dr. Laurell Josephs, 11/28 Patient presented with fever, leukocytosis and lactate 2.8 due to infection.    Taken to OR where I&D of necrotizing infection was performed, cultures sent. -Continue vancomycin, clindamycin, meropenem   Hx Recurrent DVT Bilateral LE [place] negative for clot -Continue Xarelto  Diabetes Glucoses elevated -Continue increase insulins -Hold glyburide, metformin -Resume statin  Suspected IBD Followed by GI. BP good, I will defer stress dosing for now. -Continue prednisone, mesalamine  Hypertension BP normal -Hold ramipril for now   Obesity BMI 34  Hyponatremia Mild asymptomatic         Disposition: Status is: Inpatient  Remains inpatient appropriate because:IV treatments appropriate due to intensity of illness or inability to take PO   Dispo: The patient is from: Home              Anticipated d/c is to: Home              Anticipated d/c date is: 3 days              Patient currently is not medically stable to d/c.              MDM: The below labs and imaging reports were reviewed and summarized above.  Medication management as above.    DVT prophylaxis:  rivaroxaban (XARELTO)  tablet 20 mg  Code Status: FULL Family Communication:     Consultants:   Gen Surg  Procedures:   11/27 I&D by Dr. Laurell Josephs  Antimicrobials:   Vanc, Meropenem, Clinda 11/27 >>   Culture data:   11/27 blood culture x1 -- ngtd  11/27 intra op I&D culture -- gram stain positive           Subjective: The patient's leg feels better than prior to surgery.  He said no fever overnight.  No confusion, no vomiting, no chills, no malaise.  No respiratory distress or chest discomfort  Objective: Vitals:   02/17/20 0515 02/17/20 0530 02/17/20 0600 02/17/20 0858  BP: 117/67 114/67  (!) 110/58  Pulse: (!) 104 (!) 104  96  Resp: (!) 29 (!) 24 20 19   Temp:  98.9 F (37.2 C)  98.5 F (36.9 C)  TempSrc:      SpO2: 93% 94%  97%  Weight:      Height:        Intake/Output Summary (Last 24 hours) at 02/17/2020 1733 Last data filed at 02/17/2020 0418 Gross per 24 hour  Intake 1202.55 ml  Output 25 ml  Net 1177.55 ml   Filed Weights   02/16/20 1748 02/17/20 0036  Weight: 115.7 kg 111 kg    Examination: General appearance: Obese adult male, alert and in no acute distress.  Very sleepy HEENT: Anicteric, conjunctiva pink, lids and lashes normal. No nasal deformity, discharge, epistaxis.  Lips moist, dentition in normal repair, oropharynx moist, no oral lesions, hearing normal.   Skin: Warm and dry.   No suspicious rashes or lesions.  The left thigh wound is covered, the dressing appears clean dry and intact Cardiac: RRR, nl S1-S2, no murmurs appreciated.  Capillary refill is brisk.  JVP not visible.  No LE edema.  Radial pulses 2+ and symmetric. Respiratory: Normal respiratory rate and rhythm.  CTAB without rales or wheezes. Abdomen: Abdomen soft.  No TTP or guarding. No ascites, distension, hepatosplenomegaly.   MSK: No deformities or effusions.  Normal muscle bulk and tone for age, no clubbing. Neuro: Awake, but sleepy, however appropriate.  EOMI, moves all extremities with  generalized weakness but symmetric strength. Speech fluent.    Psych: Sensorium intact and responding to questions, sleepy but appropriately interactive.  Attention normal.  Affect intact by recent administration of opiate..  Judgment and insight appear normal.    Data Reviewed: I have personally reviewed following labs and imaging studies:  CBC: Recent Labs  Lab 02/16/20 1752 02/17/20 0130  WBC 20.5* 19.2*  NEUTROABS 15.5* 14.3*  HGB 15.6 12.5*  HCT 47.3 36.9*  MCV 85.4 84.2  PLT 202 185   Basic Metabolic Panel: Recent Labs  Lab 02/16/20 1752 02/17/20 0130  NA 130* 130*  K 4.8 3.8  CL 93* 100  CO2 24 18*  GLUCOSE 386* 274*  BUN 15 13  CREATININE 1.12 0.98  CALCIUM 9.3 7.7*   GFR: Estimated Creatinine Clearance: 124.4 mL/min (by C-G formula based on SCr of 0.98 mg/dL). Liver Function Tests: Recent Labs  Lab 02/16/20 1752 02/17/20 0130  AST 15 11*  ALT 14 12  ALKPHOS 110 77  BILITOT 2.2* 2.2*  PROT 8.1 6.0*  ALBUMIN 3.2* 2.3*   No results for input(s): LIPASE, AMYLASE in the last 168 hours. No results for input(s): AMMONIA in the last 168 hours. Coagulation Profile: Recent Labs  Lab 02/17/20 0130  INR 1.4*   Cardiac Enzymes: No results for input(s): CKTOTAL, CKMB, CKMBINDEX, TROPONINI in the last 168 hours. BNP (last 3 results) No results for input(s): PROBNP in the last 8760 hours. HbA1C: Recent Labs    02/16/20 2225  HGBA1C 12.5*   CBG: Recent Labs  Lab 02/16/20 1709 02/16/20 2245 02/17/20 0424 02/17/20 0746 02/17/20 1242  GLUCAP 372* 269* 233* 209* 241*   Lipid Profile: No results for input(s): CHOL, HDL, LDLCALC, TRIG, CHOLHDL, LDLDIRECT in the last 72 hours. Thyroid Function Tests: No results for input(s): TSH, T4TOTAL, FREET4, T3FREE, THYROIDAB in the last 72 hours. Anemia Panel: No results for input(s): VITAMINB12, FOLATE, FERRITIN, TIBC, IRON, RETICCTPCT in the last 72 hours. Urine analysis:    Component Value Date/Time    COLORURINE YELLOW 06/22/2018 1320   APPEARANCEUR CLEAR 06/22/2018 1320   LABSPEC 1.029 06/22/2018 1320   PHURINE 5.0 06/22/2018 1320   GLUCOSEU NEGATIVE 06/22/2018 1320   HGBUR NEGATIVE 06/22/2018 1320   BILIRUBINUR NEGATIVE 06/22/2018 1320   KETONESUR NEGATIVE 06/22/2018 1320   PROTEINUR 30 (A) 06/22/2018 1320   UROBILINOGEN 0.2 09/30/2012 1937   NITRITE NEGATIVE 06/22/2018 1320   LEUKOCYTESUR NEGATIVE 06/22/2018 1320   Sepsis Labs: @LABRCNTIP (procalcitonin:4,lacticacidven:4)  ) Recent Results (from the past 240 hour(s))  Resp Panel by RT-PCR (Flu A&B, Covid) Nasopharyngeal Swab     Status: None   Collection Time: 02/16/20  5:52 PM   Specimen: Nasopharyngeal Swab; Nasopharyngeal(NP) swabs in  vial transport medium  Result Value Ref Range Status   SARS Coronavirus 2 by RT PCR NEGATIVE NEGATIVE Final    Comment: (NOTE) SARS-CoV-2 target nucleic acids are NOT DETECTED.  The SARS-CoV-2 RNA is generally detectable in upper respiratory specimens during the acute phase of infection. The lowest concentration of SARS-CoV-2 viral copies this assay can detect is 138 copies/mL. A negative result does not preclude SARS-Cov-2 infection and should not be used as the sole basis for treatment or other patient management decisions. A negative result may occur with  improper specimen collection/handling, submission of specimen other than nasopharyngeal swab, presence of viral mutation(s) within the areas targeted by this assay, and inadequate number of viral copies(<138 copies/mL). A negative result must be combined with clinical observations, patient history, and epidemiological information. The expected result is Negative.  Fact Sheet for Patients:  BloggerCourse.com  Fact Sheet for Healthcare Providers:  SeriousBroker.it  This test is no t yet approved or cleared by the [place] FDA and  has been authorized for detection and/or  diagnosis of SARS-CoV-2 by FDA under an Emergency Use Authorization (EUA). This EUA will remain  in effect (meaning this test can be used) for the duration of the COVID-19 declaration under Section 564(b)(1) of the Act, 21 U.S.C.section 360bbb-3(b)(1), unless the authorization is terminated  or revoked sooner.       Influenza A by PCR NEGATIVE NEGATIVE Final   Influenza B by PCR NEGATIVE NEGATIVE Final    Comment: (NOTE) The Xpert Xpress SARS-CoV-2/FLU/RSV plus assay is intended as an aid in the diagnosis of influenza from Nasopharyngeal swab specimens and should not be used as a sole basis for treatment. Nasal washings and aspirates are unacceptable for Xpert Xpress SARS-CoV-2/FLU/RSV testing.  Fact Sheet for Patients: BloggerCourse.com  Fact Sheet for Healthcare Providers: SeriousBroker.it  This test is not yet approved or cleared by the [place] FDA and has been authorized for detection and/or diagnosis of SARS-CoV-2 by FDA under an Emergency Use Authorization (EUA). This EUA will remain in effect (meaning this test can be used) for the duration of the COVID-19 declaration under Section 564(b)(1) of the Act, 21 U.S.C. section 360bbb-3(b)(1), unless the authorization is terminated or revoked.  Performed at [facility], [address] [place], [place] [address]   Culture, blood (single)     Status: None (Preliminary result)   Collection Time: 02/16/20  8:07 PM   Specimen: BLOOD RIGHT ARM  Result Value Ref Range Status   Specimen Description BLOOD RIGHT ARM  Final   Special Requests   Final    BOTTLES DRAWN AEROBIC AND ANAEROBIC Blood Culture adequate volume   Culture   Final    NO GROWTH < 12 HOURS Performed at [facility], [address], [place], [place] [address]    Report Status PENDING  Incomplete  Surgical PCR screen     Status: None   Collection Time: 02/17/20  1:50 AM   Specimen: Nasal  Mucosa; Nasal Swab  Result Value Ref Range Status   MRSA, PCR NEGATIVE NEGATIVE Final   Staphylococcus aureus NEGATIVE NEGATIVE Final    Comment: (NOTE) The Xpert SA Assay (FDA approved for NASAL specimens in patients 40 years of age and older), is one component of a comprehensive surveillance program. It is not intended to diagnose infection nor to guide or monitor treatment. Performed at [facility], [address] [place], [place] [address]   Aerobic/Anaerobic Culture (surgical/deep wound)  Status: None (Preliminary result)   Collection Time: 02/17/20  3:47 AM   Specimen: Groin, Left; Abscess  Result Value Ref Range Status   Specimen Description ABSCESS LEFT GROIN  Final   Special Requests PATIENT ON FOLLOWING VANCOMYCIN,ROCEPHIN ID A  Final   Gram Stain   Final    FEW WBC PRESENT,BOTH PMN AND MONONUCLEAR ABUNDANT GRAM NEGATIVE RODS FEW GRAM POSITIVE COCCI IN PAIRS Performed at [facility], [address] [place], [place] [address]    Culture PENDING  Incomplete   Report Status PENDING  Incomplete         Radiology Studies: CT PELVIS W CONTRAST  Result Date: 02/17/2020 CLINICAL DATA:  Boil in the left groin for 5 days. Soft tissue infection is suspected. EXAM: CT PELVIS WITH CONTRAST TECHNIQUE: Multidetector CT imaging of the pelvis was performed using the standard protocol following the bolus administration of intravenous contrast. CONTRAST:  100mL OMNIPAQUE IOHEXOL 300 MG/ML  SOLN COMPARISON:  06/22/2018 FINDINGS: Urinary Tract:  Bladder is normal.  Distal ureters are not dilated. Bowel: Visualized parts of the small and large bowel are normal. Appendix is normal. Vascular/Lymphatic: Minimal calcification in the iliac arteries. No aneurysm. Small pelvic lymph nodes are likely reactive. No significant lymphadenopathy. Reproductive:  Prostate gland is not enlarged. Other: Inflammatory stranding is demonstrated in the subcutaneous fat of the left groin,  extending down along the anteromedial left thigh. In the anteromedial left thigh, there is a focal collection, predominantly gas, measuring 2.2 x 7.7 cm in diameter. This is likely abscess and cellulitis. Fournier's gangrene may be present. Musculoskeletal: Visualized lower lumbar spine, sacrum, pelvis, and hips are unremarkable. No evidence of osteomyelitis. IMPRESSION: Inflammatory stranding in the subcutaneous fat of the left groin, extending down along the anteromedial left thigh. Focal predominately gas collection in the anteromedial left thigh, measuring 2.2 x 7.7 cm in diameter. This is likely abscess and cellulitis. Fournier's gangrene may be present. Electronically Signed   By: Burman NievesWilliam  Stevens M.D.   On: 02/17/2020 00:50   DG Chest Portable 1 View  Result Date: 02/16/2020 CLINICAL DATA:  Shortness of breath. EXAM: PORTABLE CHEST 1 VIEW COMPARISON:  March 24, 2016 FINDINGS: The heart size and mediastinal contours are within normal limits. Both lungs are clear. The visualized skeletal structures are unremarkable. IMPRESSION: No active disease. Electronically Signed   By: Aram Candelahaddeus  Houston M.D.   On: 02/16/2020 18:01   US LT LOWER EXTREM LTD SOFT TISSUE NON VASCULAR  Result Date: 02/16/2020 CLINICAL DATA:  Redness and swelling in the left groin.  Sepsis. EXAM: ULTRASOUND LEFT LOWER EXTREMITY LIMITED TECHNIQUE: Ultrasound examination of the lower extremity soft tissues was performed in the area of clinical concern. COMPARISON:  None. FINDINGS: In the region of the left perineum, there is a 2.5 x 2.9 x 2.7 cm collection within the subcutaneous soft tissues. There are areas of ring down artifact. There is overlying skin thickening. There is extensive soft tissue swelling in the upper thigh. IMPRESSION: 1. In the region of the left perineum there is a 2.5 x 2.9 x 2.7 cm collection. This collection contains areas of ring down artifact suggestive of air and is highly concerning for an abscess or  necrotizing infection such as Fournier's gangrene. Follow-up with a contrast enhanced CT is recommended. 2. Soft tissue edema and skin thickening is noted in the proximal left thigh consistent with cellulitis. These results were called by telephone at the time of interpretation on 02/16/2020 at 10:37 pm to provider [facility] , who verbally acknowledged these results. Electronically Signed   By: Katherine Mantle M.D.   On: 02/16/2020 22:37        Scheduled Meds: . [START ON 02/18/2020] influenza vac split quadrivalent PF  0.5 mL Intramuscular Tomorrow-1000  . insulin aspart  0-20 Units Subcutaneous TID WC  . insulin aspart  0-5 Units Subcutaneous QHS  . insulin aspart  4 Units Subcutaneous TID WC  . insulin glargine  26 Units Subcutaneous Daily  . mupirocin ointment  1 application Nasal BID  . predniSONE  10 mg Oral Q breakfast  . rivaroxaban  20 mg Oral QAC supper   Continuous Infusions: . clindamycin (CLEOCIN) IV 600 mg (02/17/20 1651)  . meropenem (MERREM) IV 1 g (02/17/20 1249)  . vancomycin 1,000 mg (02/17/20 1036)     LOS: 1 day    Time spent: 25 minutes    Alberteen Sam, MD [facility] 02/17/2020, 5:33 PM     Please page though AMION or Epic secure chat:  For [employer], Higher education careers adviser

## 2020-02-17 NOTE — Progress Notes (Signed)
HOSPITAL MEDICINE OVERNIGHT EVENT NOTE    Ultrasound of left groin revealed fluid collection with findings concerning for air concerning for abscess or necrotizing infection.  Radiology also mentioned concern for Fournier's gangrene.  At that time I discussed the case with Dr. Liliane Shi with urology who recommended proceeding with CT scan of abdomen and pelvis with contrast.  Dr. Liliane Shi stated that if the actual genitalia are not involved on examination and Fournier's gangrene is unlikely.  CT imaging of abdomen and pelvis ordered as recommended revealing focal predominantly gas collection in the anterior medial left thigh measuring 2.2 x 7.7 cm in diameter concerning for abscess and cellulitis.   Patient was reevaluated at the bedside.  I have confirmed that the actual genitalia are not involved at this time.  Due to concerns for gas collecting infection and possible necrotizing fasciitis, case was discussed with Dr. Janee Morn with general surgery who has graciously agreed to come evaluate the patient this evening and evaluate for potential operative intervention.  To cover for the possibility of necrotizing fasciitis I have discontinued the patient's cefepime and switch the patient to intravenous meropenem and clindamycin in addition to his vancomycin.  Continue close clinical monitoring.  Marinda Elk  MD [facility]

## 2020-02-17 NOTE — Plan of Care (Signed)

## 2020-02-17 NOTE — Anesthesia Preprocedure Evaluation (Addendum)
Anesthesia Evaluation  Patient identified by MRN, date of birth, ID band Patient awake    Reviewed: Allergy & Precautions, NPO status , Patient's Chart, lab work & pertinent test results  History of Anesthesia Complications Negative for: history of anesthetic complications  Airway Mallampati: III  TM Distance: >3 FB Neck ROM: Full    Dental  (+) Dental Advisory Given   Pulmonary neg shortness of breath, neg sleep apnea, neg COPD, neg recent URI, former smoker,  Covid-19 Nucleic Acid Test Results Lab Results      Component                Value               Date                      SARSCOV2NAA              NEGATIVE            02/16/2020                SARSCOV2NAA              NEGATIVE            11/11/2019                SARSCOV2NAA              Detected (A)        03/13/2019                SARSCOV2NAA              Not Detected        12/14/2018                SARSCOV2                 POSITIVE (A)        03/14/2019                SARSCOV2                 POSITIVE (A)        03/14/2019              breath sounds clear to auscultation       Cardiovascular hypertension, Pt. on medications (-) angina+ DVT  (-) Past MI and (-) CHF (-) dysrhythmias  Rhythm:Regular     Neuro/Psych negative neurological ROS  negative psych ROS   GI/Hepatic negative GI ROS,   Endo/Other  diabetes, Poorly Controlled, Insulin DependentLast BG 274 Lab Results      Component                Value               Date                      HGBA1C                   12.5 (H)            02/16/2020            Renal/GU Renal diseaseLab Results      Component                Value               Date  CREATININE               0.98                02/17/2020           Lab Results      Component                Value               Date                      K                        3.8                 02/17/2020                 Musculoskeletal negative musculoskeletal ROS (+)   Abdominal   Peds  Hematology  (+) Blood dyscrasia, anemia , Lab Results      Component                Value               Date                      WBC                      19.2 (H)            02/17/2020                HGB                      12.5 (L)            02/17/2020                HCT                      36.9 (L)            02/17/2020                MCV                      84.2                02/17/2020                PLT                      185                 02/17/2020            xarelto   Anesthesia Other Findings   Reproductive/Obstetrics                            Anesthesia Physical Anesthesia Plan  ASA: III  Anesthesia Plan: General   Post-op Pain Management:    Induction: Intravenous  PONV Risk Score and Plan: 2 and Ondansetron and Midazolam  Airway Management Planned: Oral ETT  Additional Equipment: None  Intra-op Plan:   Post-operative Plan: Extubation in OR  Informed Consent: I have reviewed the patients History and Physical, chart, labs and discussed  the procedure including the risks, benefits and alternatives for the proposed anesthesia with the patient or authorized representative who has indicated his/her understanding and acceptance.     Dental advisory given  Plan Discussed with: CRNA and Surgeon  Anesthesia Plan Comments:         Anesthesia Quick Evaluation

## 2020-02-17 NOTE — Progress Notes (Signed)
Pharmacy Antibiotic Note  NTHONY Clayton is a 44 y.o. male admitted on 02/16/2020 with cellulitis.  Pharmacy has been consulted to change cefepime to meropenem given concern for necrotizing fasciitis.  Plan: Merrem 1g IV Q8H.  Height: 5\' 11"  (180.3 cm) Weight: 111 kg (244 lb 11.4 oz) IBW/kg (Calculated) : 75.3  Temp (24hrs), Avg:100.2 F (37.9 C), Min:98.9 F (37.2 C), Max:102 F (38.9 C)  Recent Labs  Lab 02/16/20 1752 02/16/20 2225  WBC 20.5*  --   CREATININE 1.12  --   LATICACIDVEN 2.8* 2.3*    Estimated Creatinine Clearance: 108.9 mL/min (by C-G formula based on SCr of 1.12 mg/dL).    No Known Allergies   Thank you for allowing pharmacy to be a part of this patient's care.  02/18/20, PharmD, BCPS  02/17/2020 1:09 AM

## 2020-02-17 NOTE — ED Notes (Signed)
Patient transported to CT 

## 2020-02-17 NOTE — ED Notes (Signed)
Pt transported from CT to 2W.

## 2020-02-17 NOTE — Consult Note (Signed)
Reason for Consult:left groin infection Referring Physician: Shauna Hugh  Noah Clayton is an 44 y.o. male.  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 He claims it started Monday and has been getting worse. CT shows a 3x7 cm area of inflammation and subcutaneous gas C/W necrotizing infection. I was asked to see him for surgical management.  Past Medical History:  Diagnosis Date  . COVID-19 02/2019  . Diabetes mellitus without complication (HCC)   . Hypertension   . Kidney stone     History reviewed. No pertinent surgical history.  Family History  Problem Relation Age of Onset  . Diabetes Mother   . Diabetes Father     Social History:  reports that he has quit smoking. He has never used smokeless tobacco. He reports that he does not drink alcohol and does not use drugs.  Allergies: No Known Allergies  Medications: I have reviewed the patient's current medications.  Results for orders placed or performed during the hospital encounter of 02/16/20 (from the past 48 hour(s))  CBG monitoring, ED     Status: Abnormal   Collection Time: 02/16/20  5:09 PM  Result Value Ref Range   Glucose-Capillary 372 (H) 70 - 99 mg/dL    Comment: Glucose reference range applies only to samples taken after fasting for at least 8 hours.  Lactic acid, plasma     Status: Abnormal   Collection Time: 02/16/20  5:52 PM  Result Value Ref Range   Lactic Acid, Venous 2.8 (HH) 0.5 - 1.9 mmol/L    Comment: CRITICAL RESULT CALLED TO, READ BACK BY AND VERIFIED WITH: EMILY DIXON RN.@[email or web address]  ON 11.27.21 BY TCALDWELL MT. Performed at [facility], [address], [place], [place] [address]   Comprehensive metabolic panel     Status: Abnormal   Collection Time: 02/16/20  5:52 PM  Result Value Ref Range   Sodium 130 (L) 135 - 145 mmol/L   Potassium 4.8 3.5 - 5.1 mmol/L   Chloride 93 (L) 98 - 111  mmol/L   CO2 24 22 - 32 mmol/L   Glucose, Bld 386 (H) 70 - 99 mg/dL    Comment: Glucose reference range applies only to samples taken after fasting for at least 8 hours.   BUN 15 6 - 20 mg/dL   Creatinine, Ser 1.85 0.61 - 1.24 mg/dL   Calcium 9.3 8.9 - 63.1 mg/dL   Total Protein 8.1 6.5 - 8.1 g/dL   Albumin 3.2 (L) 3.5 - 5.0 g/dL   AST 15 15 - 41 U/L   ALT 14 0 - 44 U/L   Alkaline Phosphatase 110 38 - 126 U/L   Total Bilirubin 2.2 (H) 0.3 - 1.2 mg/dL   GFR, Estimated >49 >70 mL/min    Comment: (NOTE) Calculated using the CKD-EPI Creatinine Equation (2021)    Anion gap 13 5 - 15    Comment: Performed at [facility], [address], [place], [place] [address]  CBC with Differential     Status: Abnormal   Collection Time: 02/16/20  5:52 PM  Result Value Ref Range   WBC 20.5 (H) 4.0 - 10.5 K/uL   RBC 5.54 4.22 - 5.81 MIL/uL   Hemoglobin 15.6 13.0 - 17.0 g/dL   HCT 58.8 39 - 52 %   MCV 85.4 80.0 - 100.0 fL   MCH 28.2 26.0 - 34.0  pg   MCHC 33.0 30.0 - 36.0 g/dL   RDW 16.114.1 09.611.5 - 04.515.5 %   Platelets 202 150 - 400 K/uL   nRBC 0.0 0.0 - 0.2 %   Neutrophils Relative % 75 %   Neutro Abs 15.5 (H) 1.7 - 7.7 K/uL   Lymphocytes Relative 13 %   Lymphs Abs 2.6 0.7 - 4.0 K/uL   Monocytes Relative 11 %   Monocytes Absolute 2.3 (H) 0.1 - 1.0 K/uL   Eosinophils Relative 0 %   Eosinophils Absolute 0.0 0.0 - 0.5 K/uL   Basophils Relative 0 %   Basophils Absolute 0.0 0.0 - 0.1 K/uL   Immature Granulocytes 1 %   Abs Immature Granulocytes 0.17 (H) 0.00 - 0.07 K/uL    Comment: Performed at [facility], [address] [place], [place] [address]  Resp Panel by RT-PCR (Flu A&B, Covid) Nasopharyngeal Swab     Status: None   Collection Time: 02/16/20  5:52 PM   Specimen: Nasopharyngeal Swab; Nasopharyngeal(NP) swabs in vial transport medium  Result Value Ref Range   SARS Coronavirus 2 by RT PCR NEGATIVE NEGATIVE    Comment: (NOTE) SARS-CoV-2 target nucleic acids are NOT  DETECTED.  The SARS-CoV-2 RNA is generally detectable in upper respiratory specimens during the acute phase of infection. The lowest concentration of SARS-CoV-2 viral copies this assay can detect is 138 copies/mL. A negative result does not preclude SARS-Cov-2 infection and should not be used as the sole basis for treatment or other patient management decisions. A negative result may occur with  improper specimen collection/handling, submission of specimen other than nasopharyngeal swab, presence of viral mutation(s) within the areas targeted by this assay, and inadequate number of viral copies(<138 copies/mL). A negative result must be combined with clinical observations, patient history, and epidemiological information. The expected result is Negative.  Fact Sheet for Patients:  BloggerCourse.comhttps://www.fda.gov/media/152166/download  Fact Sheet for Healthcare Providers:  SeriousBroker.ithttps://www.fda.gov/media/152162/download  This test is no t yet approved or cleared by the [place] FDA and  has been authorized for detection and/or diagnosis of SARS-CoV-2 by FDA under an Emergency Use Authorization (EUA). This EUA will remain  in effect (meaning this test can be used) for the duration of the COVID-19 declaration under Section 564(b)(1) of the Act, 21 U.S.C.section 360bbb-3(b)(1), unless the authorization is terminated  or revoked sooner.       Influenza A by PCR NEGATIVE NEGATIVE   Influenza B by PCR NEGATIVE NEGATIVE    Comment: (NOTE) The Xpert Xpress SARS-CoV-2/FLU/RSV plus assay is intended as an aid in the diagnosis of influenza from Nasopharyngeal swab specimens and should not be used as a sole basis for treatment. Nasal washings and aspirates are unacceptable for Xpert Xpress SARS-CoV-2/FLU/RSV testing.  Fact Sheet for Patients: BloggerCourse.comhttps://www.fda.gov/media/152166/download  Fact Sheet for Healthcare Providers: SeriousBroker.ithttps://www.fda.gov/media/152162/download  This test is not yet approved or  cleared by the [place] FDA and has been authorized for detection and/or diagnosis of SARS-CoV-2 by FDA under an Emergency Use Authorization (EUA). This EUA will remain in effect (meaning this test can be used) for the duration of the COVID-19 declaration under Section 564(b)(1) of the Act, 21 U.S.C. section 360bbb-3(b)(1), unless the authorization is terminated or revoked.  Performed at [facility], [address] [place], [place] [address]   Procalcitonin - Baseline     Status: None   Collection Time: 02/16/20 10:25 PM  Result Value Ref Range   Procalcitonin 0.29 ng/mL    Comment:  Interpretation: PCT (Procalcitonin) <= 0.5 ng/mL: Systemic infection (sepsis) is not likely. Local bacterial infection is possible. (NOTE)       Sepsis PCT Algorithm           Lower Respiratory Tract                                      Infection PCT Algorithm    ----------------------------     ----------------------------         PCT < 0.25 ng/mL                PCT < 0.10 ng/mL          Strongly encourage             Strongly discourage   discontinuation of antibiotics    initiation of antibiotics    ----------------------------     -----------------------------       PCT 0.25 - 0.50 ng/mL            PCT 0.10 - 0.25 ng/mL               OR       >80% decrease in PCT            Discourage initiation of                                            antibiotics      Encourage discontinuation           of antibiotics    ----------------------------     -----------------------------         PCT >= 0.50 ng/mL              PCT 0.26 - 0.50 ng/mL               AND        <80% decrease in PCT             Encourage initiation of                                             antibiotics       Encourage continuation           of antibiotics    ----------------------------     -----------------------------        PCT >= 0.50 ng/mL                  PCT > 0.50 ng/mL               AND          increase in PCT                  Strongly encourage                                      initiation of antibiotics    Strongly encourage escalation           of antibiotics                                     -----------------------------  PCT <= 0.25 ng/mL                                                 OR                                        > 80% decrease in PCT                                      Discontinue / Do not initiate                                             antibiotics  Performed at [facility], [address] [place], [place] [address]   C-reactive protein     Status: Abnormal   Collection Time: 02/16/20 10:25 PM  Result Value Ref Range   CRP 19.8 (H) <1.0 mg/dL    Comment: Performed at [facility], [address], [place], [place] [address]  Lactic acid, plasma     Status: Abnormal   Collection Time: 02/16/20 10:25 PM  Result Value Ref Range   Lactic Acid, Venous 2.3 (HH) 0.5 - 1.9 mmol/L    Comment: CRITICAL VALUE NOTED.  VALUE IS CONSISTENT WITH PREVIOUSLY REPORTED AND CALLED VALUE. Performed at [facility], [address] [place], [place] [address]   Hemoglobin A1c     Status: Abnormal   Collection Time: 02/16/20 10:25 PM  Result Value Ref Range   Hgb A1c MFr Bld 12.5 (H) 4.8 - 5.6 %    Comment: (NOTE) Pre diabetes:          5.7%-6.4%  Diabetes:              >6.4%  Glycemic control for   <7.0% adults with diabetes    Mean Plasma Glucose 312.05 mg/dL    Comment: Performed at [facility], [address] [place], [place] [address]  CBG monitoring, ED     Status: Abnormal   Collection Time: 02/16/20 10:45 PM  Result Value Ref Range   Glucose-Capillary 269 (H) 70 - 99 mg/dL    Comment: Glucose reference range applies only to samples taken after fasting for at least 8 hours.    CT PELVIS W CONTRAST  Result Date: 02/17/2020 CLINICAL DATA:  Boil in the left groin for 5 days. Soft  tissue infection is suspected. EXAM: CT PELVIS WITH CONTRAST TECHNIQUE: Multidetector CT imaging of the pelvis was performed using the standard protocol following the bolus administration of intravenous contrast. CONTRAST:  OMNIPAQUE IOHEXOL 300 MG/ML  SOLN COMPARISON:  06/22/2018 FINDINGS: Urinary Tract:  Bladder is normal.  Distal ureters are not dilated. Bowel: Visualized parts of the small and large bowel are normal. Appendix is normal. Vascular/Lymphatic: Minimal calcification in the iliac arteries. No aneurysm. Small pelvic lymph nodes are likely reactive. No significant lymphadenopathy. Reproductive:  Prostate gland is not enlarged. Other: Inflammatory stranding is demonstrated in the subcutaneous fat of the left groin, extending down along the anteromedial left thigh. In the anteromedial  left thigh, there is a focal collection, predominantly gas, measuring 2.2 x 7.7 cm in diameter. This is likely abscess and cellulitis. Fournier's gangrene may be present. Musculoskeletal: Visualized lower lumbar spine, sacrum, pelvis, and hips are unremarkable. No evidence of osteomyelitis. IMPRESSION: Inflammatory stranding in the subcutaneous fat of the left groin, extending down along the anteromedial left thigh. Focal predominately gas collection in the anteromedial left thigh, measuring 2.2 x 7.7 cm in diameter. This is likely abscess and cellulitis. Fournier's gangrene may be present. Electronically Signed   By: Burman Nieves M.D.   On: 02/17/2020 00:50   DG Chest Portable 1 View  Result Date: 02/16/2020 CLINICAL DATA:  Shortness of breath. EXAM: PORTABLE CHEST 1 VIEW COMPARISON:  March 24, 2016 FINDINGS: The heart size and mediastinal contours are within normal limits. Both lungs are clear. The visualized skeletal structures are unremarkable. IMPRESSION: No active disease. Electronically Signed   By: Aram Candela M.D.   On: 02/16/2020 18:01   [place] LT LOWER EXTREM LTD SOFT TISSUE NON  VASCULAR  Result Date: 02/16/2020 CLINICAL DATA:  Redness and swelling in the left groin.  Sepsis. EXAM: ULTRASOUND LEFT LOWER EXTREMITY LIMITED TECHNIQUE: Ultrasound examination of the lower extremity soft tissues was performed in the area of clinical concern. COMPARISON:  None. FINDINGS: In the region of the left perineum, there is a 2.5 x 2.9 x 2.7 cm collection within the subcutaneous soft tissues. There are areas of ring down artifact. There is overlying skin thickening. There is extensive soft tissue swelling in the upper thigh. IMPRESSION: 1. In the region of the left perineum there is a 2.5 x 2.9 x 2.7 cm collection. This collection contains areas of ring down artifact suggestive of air and is highly concerning for an abscess or necrotizing infection such as Fournier's gangrene. Follow-up with a contrast enhanced CT is recommended. 2. Soft tissue edema and skin thickening is noted in the proximal left thigh consistent with cellulitis. These results were called by telephone at the time of interpretation on 02/16/2020 at 10:37 pm to provider [facility] , who verbally acknowledged these results. Electronically Signed   By: Katherine Mantle M.D.   On: 02/16/2020 22:37    Review of Systems  Constitutional: Negative.   HENT: Negative.   Eyes: Negative.   Respiratory: Negative.   Cardiovascular: Negative.   Gastrointestinal: Negative.   Endocrine:       DM  Musculoskeletal:       Pain and redness L groin  Allergic/Immunologic: Negative.   Neurological: Negative.   Hematological:       Xarelto  Psychiatric/Behavioral: Negative.    Blood pressure 131/77, pulse 100, temperature 100.2 F (37.9 C), temperature source Oral, resp. rate 16, height 5\' 11"  (1.803 m), weight 111 kg, SpO2 98 %. Physical Exam Constitutional:      General: He is not in acute distress.    Appearance: He is obese.  HENT:     Head: Normocephalic.     Right Ear: External ear normal.     Left Ear: External ear  normal.     Mouth/Throat:     Mouth: Mucous membranes are moist.  Eyes:     Pupils: Pupils are equal, round, and reactive to light.  Cardiovascular:     Rate and Rhythm: Normal rate and regular rhythm.     Pulses: Normal pulses.     Heart sounds: Normal heart sounds.  Pulmonary:     Effort: Pulmonary effort is normal.  Breath sounds: Normal breath sounds.  Abdominal:     General: Abdomen is flat. There is no distension.     Palpations: Abdomen is soft.     Tenderness: There is no abdominal tenderness.  Musculoskeletal:     Comments: Red fluctuant area L groin at top of thigh  Skin:    General: Skin is warm.  Neurological:     Mental Status: He is alert and oriented to person, place, and time.  Psychiatric:        Mood and Affect: Mood normal.     Assessment/Plan: Necrotizing soft tissue infection L groin - on Vanc and Merrem. Will proceed to OR for emergent debridement L groin. I discussed the risks and benefits. Increased bleeding risk with Xarelto but this is a dangerous infection and it needs emergent debridement. He agrees. Site marked.  Liz Malady 02/17/2020, 1:52 AM

## 2020-02-17 NOTE — Transfer of Care (Signed)
Immediate Anesthesia Transfer of Care Note  Patient: Noah Clayton  Procedure(s) Performed: INCISION AND DRAINAGE, GROIN (Left Groin)  Patient Location: PACU  Anesthesia Type:General  Level of Consciousness: drowsy  Airway & Oxygen Therapy: Patient Spontanous Breathing and Patient connected to face mask oxygen  Post-op Assessment: Report given to RN and Post -op Vital signs reviewed and stable  Post vital signs: Reviewed and stable  Last Vitals:  Vitals Value Taken Time  BP    Temp    Pulse    Resp    SpO2      Last Pain:  Vitals:   02/17/20 0036  TempSrc: Oral  PainSc:          Complications: No complications documented.

## 2020-02-17 NOTE — Anesthesia Procedure Notes (Signed)
Procedure Name: Intubation Date/Time: 02/17/2020 3:47 AM Performed by: Claudina Lick, CRNA Pre-anesthesia Checklist: Patient identified, Emergency Drugs available, Suction available, Patient being monitored and Timeout performed Patient Re-evaluated:Patient Re-evaluated prior to induction Oxygen Delivery Method: Circle system utilized Preoxygenation: Pre-oxygenation with 100% oxygen Induction Type: IV induction, Cricoid Pressure applied and Rapid sequence Laryngoscope Size: Miller and 2 Grade View: Grade I Tube type: Oral Tube size: 7.5 mm Number of attempts: 1 Airway Equipment and Method: Stylet Placement Confirmation: ETT inserted through vocal cords under direct vision,  positive ETCO2 and breath sounds checked- equal and bilateral Secured at: 22 cm Tube secured with: Tape Dental Injury: Teeth and Oropharynx as per pre-operative assessment

## 2020-02-18 ENCOUNTER — Encounter (HOSPITAL_COMMUNITY): Payer: Self-pay | Admitting: General Surgery

## 2020-02-18 LAB — BASIC METABOLIC PANEL
Anion gap: 6 (ref 5–15)
BUN: 9 mg/dL (ref 6–20)
CO2: 24 mmol/L (ref 22–32)
Calcium: 8.1 mg/dL — ABNORMAL LOW (ref 8.9–10.3)
Chloride: 102 mmol/L (ref 98–111)
Creatinine, Ser: 0.84 mg/dL (ref 0.61–1.24)
GFR, Estimated: >60 mL/min (ref >60)
Glucose, Bld: 264 mg/dL — ABNORMAL HIGH (ref 70–99)
Potassium: 3.6 mmol/L (ref 3.5–5.1)
Sodium: 132 mmol/L — ABNORMAL LOW (ref 135–145)

## 2020-02-18 LAB — CBC
HCT: 35.6 % — ABNORMAL LOW (ref 39.0–52.0)
Hemoglobin: 11.7 g/dL — ABNORMAL LOW (ref 13.0–17.0)
MCH: 28.2 pg (ref 26.0–34.0)
MCHC: 32.9 g/dL (ref 30.0–36.0)
MCV: 85.8 fL (ref 80.0–100.0)
Platelets: 191 10*3/uL (ref 150–400)
RBC: 4.15 MIL/uL — ABNORMAL LOW (ref 4.22–5.81)
RDW: 14.4 % (ref 11.5–15.5)
WBC: 14 10*3/uL — ABNORMAL HIGH (ref 4.0–10.5)
nRBC: 0 % (ref 0.0–0.2)

## 2020-02-18 LAB — GLUCOSE, CAPILLARY
Glucose-Capillary: 201 mg/dL — ABNORMAL HIGH (ref 70–99)
Glucose-Capillary: 271 mg/dL — ABNORMAL HIGH (ref 70–99)
Glucose-Capillary: 211 mg/dL — ABNORMAL HIGH (ref 70–99)
Glucose-Capillary: 242 mg/dL — ABNORMAL HIGH (ref 70–99)

## 2020-02-18 MED ORDER — INSULIN ASPART 100 UNIT/ML ~~LOC~~ SOLN
6.0000 [IU] | Freq: Three times a day (TID) | SUBCUTANEOUS | Status: DC
  Administered 2020-02-18 – 2020-02-19 (×3): 6 [IU] via SUBCUTANEOUS

## 2020-02-18 MED ORDER — INSULIN GLARGINE 100 UNIT/ML ~~LOC~~ SOLN
40.0000 [IU] | Freq: Every day | SUBCUTANEOUS | Status: DC
  Administered 2020-02-19: 40 [IU] via SUBCUTANEOUS
  Filled 2020-02-18: qty 0.4

## 2020-02-18 MED ORDER — INSULIN GLARGINE 100 UNIT/ML ~~LOC~~ SOLN
10.0000 [IU] | Freq: Once | SUBCUTANEOUS | Status: AC
  Administered 2020-02-18: 10 [IU] via SUBCUTANEOUS
  Filled 2020-02-18: qty 0.1

## 2020-02-18 NOTE — Progress Notes (Addendum)
Inpatient Diabetes Program Recommendations  AACE/ADA: New Consensus Statement on Inpatient Glycemic Control (2015)  Target Ranges:  Prepandial:   less than 140 mg/dL      Peak postprandial:   less than 180 mg/dL (1-2 hours)      Critically ill patients:  140 - 180 mg/dL   Results for Noah Clayton, Noah Clayton (MRN [id number]) as of 02/18/2020 10:22  Ref. Range 02/17/2020 07:46 02/17/2020 12:42 02/17/2020 17:34 02/17/2020 20:54  Glucose-Capillary Latest Ref Range: 70 - 99 mg/dL 209 (H)  7 units NOVOLOG  241 (H)  11 units NOVOLOG  26 units LANTUS @10 :35am  248 (H)  11 units NOVOLOG  259 (H)  3 units NOVOLOG    Results for Noah Clayton, Noah Clayton (MRN [id number]) as of 02/18/2020 10:22  Ref. Range 02/18/2020 07:29  Glucose-Capillary Latest Ref Range: 70 - 99 mg/dL 201 (H)  11 units NOVOLOG  26 units LANTUS   Results for Noah Clayton, Noah Clayton (MRN [id number]) as of 02/18/2020 10:22  Ref. Range 02/16/2020 22:25  Hemoglobin A1C Latest Ref Range: 4.8 - 5.6 % 12.5 (H)  (312 mg/dl)    Admit with: Necrotizing fasciitis of Groin/ Sepsis   History: DM  Home DM Meds: Lantus 40 units Daily       Glyburide 5 mg Daily       Metformin 750 mg Daily  Current Orders: Lantus 26 units Daily      Novolog Resistant Correction Scale/ SSI (0-20 units) TID AC + HS      Novolog 4 units TID with meals    Underwent I&D 11/28  Getting Prednisone 10 mg Daily   MD- Note CBG 201 this AM.  Please consider:  1. Increase Lantus to 30 units Daily  2. Increase Novolog Meal Coverage to 6 units TID with meals  Spoke with pt at bedside today.  Has not been taking meds consistently at home due to family stressors.  Is willing to escalate insulin therapy at home and is willing to take Novolog with meals if needed to help improve his A1c and improve his health.  May consider at time of discharge home, stopping the Glyburide and giving pt a Rx for Novolog SSI to use TID before meals at home until he can follow up  with his PCP.  Pt would like to keep his Metformin at 750 mg Daily due to stomach upset with increased Metformin dose.    Addendum 12:30pm--Met with pt at bedside this AM.  Told me he has a CBG meter at home but does not check often.  Has not been taking meds consistently at home due to family stressors--only taking meds about 3 times per week per pt report.  Told me he would like to get a [employer] for home so he can do better with his CBG checks--pt has been working with his PCP Dr. Delfina Redwood on getting the [facility] for home.  Discussed with pt the benefits of CGM at home and strongly encouraged pt to follow up with his PCP about this.  Discussed with pt the importance of good CBG control both in the hospital and at home to help with wound healing and prevent further complications.  Pt told me he has had a "wake-up call" and is ready to do better with his diabetes.  Has had diabetes for about 11 years and knows he needs to get his A1c down to 7% or better and is willing to intesify meds at home if needed.  Pt would like to  keep his Metformin at 750 mg Daily due to stomach upset with increased Metformin dose but is willing to take Novolog TID with meals at home if this will help his CBG control.  Discussed with pt that I will send this info along to the hospital MD and that ultimately they will decide what meds to change/increase for home.  Strongly encouraged pt to seek care under an Endocrinolgist--explained that given his young age and current A1c it would likely benefit pt to see a specialist.  I will place several local ENDO practices in the chart for pt to review.  Also discussed DM diet information with patient.  Encouraged patient to avoid beverages with sugar (regular soda, sweet tea, lemonade, fruit juice) and to consume mostly water.  Discussed what foods contain carbohydrates and how carbohydrates affect the body's blood sugar levels.  Encouraged patient to be careful with his portion  sizes (especially grains, starchy vegetables, and fruits) and briefly explained the plate method of eating.  Will also attach some educational info regarding diabetes diet to pt's AVS.  Pt very appreciative of visit.  --Will follow patient during hospitalization--  Wyn Quaker RN, MSN, CDE Diabetes Coordinator Inpatient Glycemic Control Team Team Pager: [phone] (8a-5p)

## 2020-02-18 NOTE — Discharge Instructions (Signed)
MIDLINE WOUND CARE: - midline dressing to be changed daily - supplies: sterile saline, kerlix, scissors, ABD pads, tape  - remove dressing and all packing carefully, moistening with sterile saline as needed to avoid packing/internal dressing sticking to the wound. - clean edges of skin around the wound with water/gauze, making sure there is no tape debris or leakage left on skin that could cause skin irritation or breakdown. - dampen and clean kerlix with sterile saline and pack wound from wound base to skin level, making sure to take note of any possible areas of wound tracking, tunneling and packing appropriately. Wound can be packed loosely. Trim kerlix to size if a whole kerlix is not required. - cover wound with a dry ABD pad and secure with tape.  - write the date/time on the dry dressing/tape to better track when the last dressing change occurred. - apply any skin protectant/powder recommended by clinician to protect skin/skin folds. - change dressing as needed if leakage occurs, wound gets contaminated, or patient requests to shower. - patient may shower daily with wound open and following the shower the wound should be dried and a clean dressing placed.    [facility] [phone]) 1. Dr. Carlus Pavlov 2. Dr. Reather Littler 3. [facility] [phone]) 1. Dr. Talmage Coin [facility] [phone]) 1. Dr. Dorisann Frames 2. Dr. Georgiann Mccoy [facility] [phone]) [[place] office]  [phone]) [[place] office] 1. Dr. Efraim Kaufmann [place] 2. Dr. Verdis Frederickson [facility]) [phone]) 1. Autumn Hudnall Yetta Barre), PA 2. Dr. Izell Starke 3. Dr. Jillyn Ledger.

## 2020-02-18 NOTE — Progress Notes (Signed)
1 Day Post-Op  Subjective: No complaints.  Doing well.  Pain is well controlled.  ROS: See above, otherwise other systems negative  Objective: Vital signs in last 24 hours: Temp:  [98 F (36.7 C)-98.1 F (36.7 C)] 98.1 F (36.7 C) (11/29 0415) Pulse Rate:  [81] 81 (11/29 0415) Resp:  [20] 20 (11/29 0415) BP: (121-143)/(84-86) 143/84 (11/29 0415) SpO2:  [98 %-100 %] 98 % (11/29 0415) Last BM Date: 02/16/20  Intake/Output from previous day: No intake/output data recorded. Intake/Output this shift: No intake/output data recorded.  PE: Skin: left groin wound is mostly clean.  Minimal amount of some soupiness in the inferior/medial aspect of the wound, but overall very clean with no further purulent drainage or necrotic tissue present.  Erythema around wound appears to be improving.  Lab Results:  Recent Labs    02/17/20 0130 02/18/20 0152  WBC 19.2* 14.0*  HGB 12.5* 11.7*  HCT 36.9* 35.6*  PLT 185 191   BMET Recent Labs    02/17/20 0130 02/18/20 0152  NA 130* 132*  K 3.8 3.6  CL 100 102  CO2 18* 24  GLUCOSE 274* 264*  BUN 13 9  CREATININE 0.98 0.84  CALCIUM 7.7* 8.1*   PT/INR Recent Labs    02/17/20 0130  LABPROT 16.3*  INR 1.4*   CMP     Component Value Date/Time   NA 132 (L) 02/18/2020 0152   K 3.6 02/18/2020 0152   CL 102 02/18/2020 0152   CO2 24 02/18/2020 0152   GLUCOSE 264 (H) 02/18/2020 0152   BUN 9 02/18/2020 0152   CREATININE 0.84 02/18/2020 0152   CALCIUM 8.1 (L) 02/18/2020 0152   PROT 6.0 (L) 02/17/2020 0130   ALBUMIN 2.3 (L) 02/17/2020 0130   AST 11 (L) 02/17/2020 0130   ALT 12 02/17/2020 0130   ALKPHOS 77 02/17/2020 0130   BILITOT 2.2 (H) 02/17/2020 0130   GFRNONAA >60 02/18/2020 0152   GFRAA >60 06/22/2018 1239   Lipase     Component Value Date/Time   LIPASE 157 (H) 06/22/2018 1239       Studies/Results: CT PELVIS W CONTRAST  Result Date: 02/17/2020 CLINICAL DATA:  Boil in the left groin for 5 days. Soft tissue  infection is suspected. EXAM: CT PELVIS WITH CONTRAST TECHNIQUE: Multidetector CT imaging of the pelvis was performed using the standard protocol following the bolus administration of intravenous contrast. CONTRAST:  OMNIPAQUE IOHEXOL 300 MG/ML  SOLN COMPARISON:  06/22/2018 FINDINGS: Urinary Tract:  Bladder is normal.  Distal ureters are not dilated. Bowel: Visualized parts of the small and large bowel are normal. Appendix is normal. Vascular/Lymphatic: Minimal calcification in the iliac arteries. No aneurysm. Small pelvic lymph nodes are likely reactive. No significant lymphadenopathy. Reproductive:  Prostate gland is not enlarged. Other: Inflammatory stranding is demonstrated in the subcutaneous fat of the left groin, extending down along the anteromedial left thigh. In the anteromedial left thigh, there is a focal collection, predominantly gas, measuring 2.2 x 7.7 cm in diameter. This is likely abscess and cellulitis. Fournier's gangrene may be present. Musculoskeletal: Visualized lower lumbar spine, sacrum, pelvis, and hips are unremarkable. No evidence of osteomyelitis. IMPRESSION: Inflammatory stranding in the subcutaneous fat of the left groin, extending down along the anteromedial left thigh. Focal predominately gas collection in the anteromedial left thigh, measuring 2.2 x 7.7 cm in diameter. This is likely abscess and cellulitis. Fournier's gangrene may be present. Electronically Signed   By: Marisa Cyphers.D.  On: 02/17/2020 00:50   DG Chest Portable 1 View  Result Date: 02/16/2020 CLINICAL DATA:  Shortness of breath. EXAM: PORTABLE CHEST 1 VIEW COMPARISON:  March 24, 2016 FINDINGS: The heart size and mediastinal contours are within normal limits. Both lungs are clear. The visualized skeletal structures are unremarkable. IMPRESSION: No active disease. Electronically Signed   By: Aram Candela M.D.   On: 02/16/2020 18:01   [place] LT LOWER EXTREM LTD SOFT TISSUE NON VASCULAR  Result  Date: 02/16/2020 CLINICAL DATA:  Redness and swelling in the left groin.  Sepsis. EXAM: ULTRASOUND LEFT LOWER EXTREMITY LIMITED TECHNIQUE: Ultrasound examination of the lower extremity soft tissues was performed in the area of clinical concern. COMPARISON:  None. FINDINGS: In the region of the left perineum, there is a 2.5 x 2.9 x 2.7 cm collection within the subcutaneous soft tissues. There are areas of ring down artifact. There is overlying skin thickening. There is extensive soft tissue swelling in the upper thigh. IMPRESSION: 1. In the region of the left perineum there is a 2.5 x 2.9 x 2.7 cm collection. This collection contains areas of ring down artifact suggestive of air and is highly concerning for an abscess or necrotizing infection such as Fournier's gangrene. Follow-up with a contrast enhanced CT is recommended. 2. Soft tissue edema and skin thickening is noted in the proximal left thigh consistent with cellulitis. These results were called by telephone at the time of interpretation on 02/16/2020 at 10:37 pm to provider [facility] , who verbally acknowledged these results. Electronically Signed   By: Katherine Mantle M.D.   On: 02/16/2020 22:37    Anti-infectives: Anti-infectives (From admission, onward)   Start     Dose/Rate Route Frequency Ordered Stop   02/17/20 1000  vancomycin (VANCOCIN) IVPB 1000 mg/200 mL premix        1,000 mg 200 mL/hr over 60 Minutes Intravenous Every 8 hours 02/17/20 0121     02/17/20 0600  clindamycin (CLEOCIN) IVPB 600 mg        600 mg 100 mL/hr over 30 Minutes Intravenous Every 8 hours 02/17/20 0108     02/17/20 0600  meropenem (MERREM) 1 g in sodium chloride 0.9 % 100 mL IVPB        1 g 200 mL/hr over 30 Minutes Intravenous Every 8 hours 02/17/20 0111     02/16/20 2200  vancomycin (VANCOREADY) IVPB 2000 mg/400 mL        2,000 mg 200 mL/hr over 120 Minutes Intravenous  Once 02/16/20 2157 02/17/20 0145   02/16/20 2200  ceFEPIme (MAXIPIME) 2 g in  sodium chloride 0.9 % 100 mL IVPB  Status:  Discontinued        2 g 200 mL/hr over 30 Minutes Intravenous Every 8 hours 02/16/20 2157 02/17/20 0108   02/16/20 1930  cefTRIAXone (ROCEPHIN) 2 g in sodium chloride 0.9 % 100 mL IVPB  Status:  Discontinued        2 g 200 mL/hr over 30 Minutes Intravenous Every 24 hours 02/16/20 1919 02/16/20 2157       Assessment/Plan DM HTN DVTs - on xarelto  POD 1, s/p I&D of left groin NSTI by Dr. Janee Morn on 11/28 -dressing changes started today, will plan BID while in the hospital -WBC down to 14K.  cxs pending but with some gram + and -.  Continue currently abx therapy -will try to arrange for [facility], but if unable will need to have nursing start to teach patient how to do his  own dressing changes -may be able to DC home in the next 1-2 days pending medical/surgical improvements  FEN - carb mod diet VTE - xarelto ID - clinda, merrem, vanc   LOS: 2 days    Letha Cape , [facility] 02/18/2020, 10:35 AM Please see Amion for pager number during day hours 7:00am-4:30pm or 7:00am -11:30am on weekends

## 2020-02-18 NOTE — Progress Notes (Signed)
[facility] PROGRESS NOTE    Noah Clayton  ZOX:[id number] DOB: [birth year] DOA: 02/16/2020 PCP: Renford Dills, MD      Brief Narrative:  Noah Clayton is a 43 y.o. 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  In the ER, patient tachycardic, tachypneic, febrile WBC 20K.  CT abdomen and pelvis showed a 2x7 likely necrotizing infection in the left anterior groin.  No FOurnier's on clinical exam.  Patient taken emergently to OR for debridement and started on empiric antibiotics.         Assessment & Plan:  Necrotizing fasciitis Sepsis due to necrotizing fasciitis S/p I&D by Dr. Laurell Josephs, 11/28 Patient presented with fever, leukocytosis and lactate 2.8 due to infection.  Taken to OR where I&D of necrotizing infection was performed, cultures sent. Remains afebrile, white blood cell count resolved Culture still pending -Continue vancomycin, clindamycin, meropenem   Hx Recurrent DVT Bilateral LE [place] negative for clot -Continue Xarelto  Diabetes Glucoses remain elevated Patient nonadherent to medicines at home, feels this is a wake-up call, open to 3 times daily aspirin at discharge -Increase Lantus -Increase mealtime aspart -Continue sliding scale corrections -Hold glyburide, metformin -Continue statin  Suspected IBD Followed by GI. BP good, I will defer stress dosing for now. -Continue prednisone, mesalamine  Hypertension BP controlled -Hold ramipril for now   Obesity BMI 34  Hyponatremia Mild asymptomatic         Disposition: Status is: Inpatient  Remains inpatient appropriate because:IV treatments appropriate due to intensity of illness or inability to take PO   Dispo: The patient is from: Home              Anticipated d/c is to: Home              Anticipated d/c date is: 1  day              Patient currently is not medically stable to d/c.              MDM: The below labs and imaging reports were reviewed and summarized above.  Medication management as above.    DVT prophylaxis:  rivaroxaban (XARELTO) tablet 20 mg  Code Status: FULL Family Communication:     Consultants:   Gen Surg  Procedures:   11/27 I&D by Dr. Laurell Josephs  Antimicrobials:   Vanc, Meropenem, Clinda 11/27 >>   Culture data:   11/27 blood culture x1 -- ngtd  11/27 intra op I&D culture -- gram stain positive           Subjective: No fevers overnight, no redness or swelling around the wound, no confusion, chills, vomiting, malaise, constitutional symptoms.  Objective: Vitals:   02/17/20 0600 02/17/20 0858 02/17/20 2109 02/18/20 0415  BP:  (!) 110/58 121/86 (!) 143/84  Pulse:  96 81 81  Resp: Temp:  98.5 F (36.9 C) 98 F (36.7 C) 98.1 F (36.7 C)  TempSrc:   Oral Oral  SpO2:  97% 100% 98%  Weight:      Height:        Intake/Output Summary (Last 24 hours) at 02/18/2020 1413 Last data filed at 02/18/2020 1000 Gross per 24 hour  Intake 1760.42 ml  Output --  Net 1760.42 ml   Filed Weights   02/16/20 1748 02/17/20 0036  Weight:  115.7 kg 111 kg    Examination: General appearance: Adult male, sitting in recliner, eating lunch by himself.    Interactive. HEENT:    Skin:    Cardiac: Regular rate and rhythm, no murmurs, no lower extremity edema. Respiratory: Respiratory effort normal, lungs clear without rales or wheezes Abdomen: Abdomen soft nontender superficial guarding. MSK:  Neuro: Extraocular movements intact, moves upper extremities normal strength and coronation, speech fluent. Psych: Attention normal, affect normal, judgment insight appear normal       Data Reviewed: I have personally reviewed following labs and imaging studies:  CBC: Recent Labs  Lab 02/16/20 1752 02/17/20 0130 02/18/20 0152  WBC 20.5* 19.2*  14.0*  NEUTROABS 15.5* 14.3*  --   HGB 15.6 12.5* 11.7*  HCT 47.3 36.9* 35.6*  MCV 85.4 84.2 85.8  PLT 202 185 191   Basic Metabolic Panel: Recent Labs  Lab 02/16/20 1752 02/17/20 0130 02/18/20 0152  NA 130* 130* 132*  K 4.8 3.8 3.6  CL 93* 100 102  CO2 24 18* 24  GLUCOSE 386* 274* 264*  BUN 15 13 9   CREATININE 1.12 0.98 0.84  CALCIUM 9.3 7.7* 8.1*   GFR: Estimated Creatinine Clearance: 145.2 mL/min (by C-G formula based on SCr of 0.84 mg/dL). Liver Function Tests: Recent Labs  Lab 02/16/20 1752 02/17/20 0130  AST 15 11*  ALT 14 12  ALKPHOS 110 77  BILITOT 2.2* 2.2*  PROT 8.1 6.0*  ALBUMIN 3.2* 2.3*   No results for input(s): LIPASE, AMYLASE in the last 168 hours. No results for input(s): AMMONIA in the last 168 hours. Coagulation Profile: Recent Labs  Lab 02/17/20 0130  INR 1.4*   Cardiac Enzymes: No results for input(s): CKTOTAL, CKMB, CKMBINDEX, TROPONINI in the last 168 hours. BNP (last 3 results) No results for input(s): PROBNP in the last 8760 hours. HbA1C: Recent Labs    02/16/20 2225  HGBA1C 12.5*   CBG: Recent Labs  Lab 02/17/20 1242 02/17/20 1734 02/17/20 2054 02/18/20 0729 02/18/20 1124  GLUCAP 241* 248* 259* 201* 271*   Lipid Profile: No results for input(s): CHOL, HDL, LDLCALC, TRIG, CHOLHDL, LDLDIRECT in the last 72 hours. Thyroid Function Tests: No results for input(s): TSH, T4TOTAL, FREET4, T3FREE, THYROIDAB in the last 72 hours. Anemia Panel: No results for input(s): VITAMINB12, FOLATE, FERRITIN, TIBC, IRON, RETICCTPCT in the last 72 hours. Urine analysis:    Component Value Date/Time   COLORURINE YELLOW 06/22/2018 1320   APPEARANCEUR CLEAR 06/22/2018 1320   LABSPEC 1.029 06/22/2018 1320   PHURINE 5.0 06/22/2018 1320   GLUCOSEU NEGATIVE 06/22/2018 1320   HGBUR NEGATIVE 06/22/2018 1320   BILIRUBINUR NEGATIVE 06/22/2018 1320   KETONESUR NEGATIVE 06/22/2018 1320   PROTEINUR 30 (A) 06/22/2018 1320   UROBILINOGEN 0.2  09/30/2012 1937   NITRITE NEGATIVE 06/22/2018 1320   LEUKOCYTESUR NEGATIVE 06/22/2018 1320   Sepsis Labs: @LABRCNTIP (procalcitonin:4,lacticacidven:4)  ) Recent Results (from the past 240 hour(s))  Resp Panel by RT-PCR (Flu A&B, Covid) Nasopharyngeal Swab     Status: None   Collection Time: 02/16/20  5:52 PM   Specimen: Nasopharyngeal Swab; Nasopharyngeal(NP) swabs in vial transport medium  Result Value Ref Range Status   SARS Coronavirus 2 by RT PCR NEGATIVE NEGATIVE Final    Comment: (NOTE) SARS-CoV-2 target nucleic acids are NOT DETECTED.  The SARS-CoV-2 RNA is generally detectable in upper respiratory specimens during the acute phase of infection. The lowest concentration of SARS-CoV-2 viral copies this assay can detect is 138 copies/mL. A negative result does not  preclude SARS-Cov-2 infection and should not be used as the sole basis for treatment or other patient management decisions. A negative result may occur with  improper specimen collection/handling, submission of specimen other than nasopharyngeal swab, presence of viral mutation(s) within the areas targeted by this assay, and inadequate number of viral copies(<138 copies/mL). A negative result must be combined with clinical observations, patient history, and epidemiological information. The expected result is Negative.  Fact Sheet for Patients:  BloggerCourse.com  Fact Sheet for Healthcare Providers:  SeriousBroker.it  This test is no t yet approved or cleared by the [place] FDA and  has been authorized for detection and/or diagnosis of SARS-CoV-2 by FDA under an Emergency Use Authorization (EUA). This EUA will remain  in effect (meaning this test can be used) for the duration of the COVID-19 declaration under Section 564(b)(1) of the Act, 21 U.S.C.section 360bbb-3(b)(1), unless the authorization is terminated  or revoked sooner.       Influenza A by PCR  NEGATIVE NEGATIVE Final   Influenza B by PCR NEGATIVE NEGATIVE Final    Comment: (NOTE) The Xpert Xpress SARS-CoV-2/FLU/RSV plus assay is intended as an aid in the diagnosis of influenza from Nasopharyngeal swab specimens and should not be used as a sole basis for treatment. Nasal washings and aspirates are unacceptable for Xpert Xpress SARS-CoV-2/FLU/RSV testing.  Fact Sheet for Patients: BloggerCourse.com  Fact Sheet for Healthcare Providers: SeriousBroker.it  This test is not yet approved or cleared by the [place] FDA and has been authorized for detection and/or diagnosis of SARS-CoV-2 by FDA under an Emergency Use Authorization (EUA). This EUA will remain in effect (meaning this test can be used) for the duration of the COVID-19 declaration under Section 564(b)(1) of the Act, 21 U.S.C. section 360bbb-3(b)(1), unless the authorization is terminated or revoked.  Performed at [facility], [address] [place], [place] [address]   Culture, blood (single)     Status: None (Preliminary result)   Collection Time: 02/16/20  8:07 PM   Specimen: BLOOD RIGHT ARM  Result Value Ref Range Status   Specimen Description BLOOD RIGHT ARM  Final   Special Requests   Final    BOTTLES DRAWN AEROBIC AND ANAEROBIC Blood Culture adequate volume   Culture   Final    NO GROWTH 2 DAYS Performed at [facility], [address], [place], [place] [address]    Report Status PENDING  Incomplete  Surgical PCR screen     Status: None   Collection Time: 02/17/20  1:50 AM   Specimen: Nasal Mucosa; Nasal Swab  Result Value Ref Range Status   MRSA, PCR NEGATIVE NEGATIVE Final   Staphylococcus aureus NEGATIVE NEGATIVE Final    Comment: (NOTE) The Xpert SA Assay (FDA approved for NASAL specimens in patients 51 years of age and older), is one component of a comprehensive surveillance program. It is not intended to diagnose infection nor  to guide or monitor treatment. Performed at [facility], [address] [place], [place] [address]   Aerobic/Anaerobic Culture (surgical/deep wound)     Status: None (Preliminary result)   Collection Time: 02/17/20  3:47 AM   Specimen: Groin, Left; Abscess  Result Value Ref Range Status   Specimen Description ABSCESS LEFT GROIN  Final   Special Requests PATIENT ON FOLLOWING VANCOMYCIN,ROCEPHIN ID A  Final   Gram Stain   Final    FEW WBC PRESENT,BOTH PMN AND MONONUCLEAR ABUNDANT GRAM NEGATIVE RODS FEW GRAM POSITIVE  COCCI IN PAIRS    Culture   Final    MODERATE GROUP B STREP(S.AGALACTIAE)ISOLATED TESTING AGAINST S. AGALACTIAE NOT ROUTINELY PERFORMED DUE TO PREDICTABILITY OF AMP/PEN/VAN SUSCEPTIBILITY. Performed at [facility], [address] [place], [place] [address]    Report Status PENDING  Incomplete         Radiology Studies: CT PELVIS W CONTRAST  Result Date: 02/17/2020 CLINICAL DATA:  Boil in the left groin for 5 days. Soft tissue infection is suspected. EXAM: CT PELVIS WITH CONTRAST TECHNIQUE: Multidetector CT imaging of the pelvis was performed using the standard protocol following the bolus administration of intravenous contrast. CONTRAST:  100mL OMNIPAQUE IOHEXOL 300 MG/ML  SOLN COMPARISON:  06/22/2018 FINDINGS: Urinary Tract:  Bladder is normal.  Distal ureters are not dilated. Bowel: Visualized parts of the small and large bowel are normal. Appendix is normal. Vascular/Lymphatic: Minimal calcification in the iliac arteries. No aneurysm. Small pelvic lymph nodes are likely reactive. No significant lymphadenopathy. Reproductive:  Prostate gland is not enlarged. Other: Inflammatory stranding is demonstrated in the subcutaneous fat of the left groin, extending down along the anteromedial left thigh. In the anteromedial left thigh, there is a focal collection, predominantly gas, measuring 2.2 x 7.7 cm in diameter. This is likely abscess and cellulitis. Fournier's  gangrene may be present. Musculoskeletal: Visualized lower lumbar spine, sacrum, pelvis, and hips are unremarkable. No evidence of osteomyelitis. IMPRESSION: Inflammatory stranding in the subcutaneous fat of the left groin, extending down along the anteromedial left thigh. Focal predominately gas collection in the anteromedial left thigh, measuring 2.2 x 7.7 cm in diameter. This is likely abscess and cellulitis. Fournier's gangrene may be present. Electronically Signed   By: Burman NievesWilliam  Stevens M.D.   On: 02/17/2020 00:50   DG Chest Portable 1 View  Result Date: 02/16/2020 CLINICAL DATA:  Shortness of breath. EXAM: PORTABLE CHEST 1 VIEW COMPARISON:  March 24, 2016 FINDINGS: The heart size and mediastinal contours are within normal limits. Both lungs are clear. The visualized skeletal structures are unremarkable. IMPRESSION: No active disease. Electronically Signed   By: Aram Candelahaddeus  Houston M.D.   On: 02/16/2020 18:01   US LT LOWER EXTREM LTD SOFT TISSUE NON VASCULAR  Result Date: 02/16/2020 CLINICAL DATA:  Redness and swelling in the left groin.  Sepsis. EXAM: ULTRASOUND LEFT LOWER EXTREMITY LIMITED TECHNIQUE: Ultrasound examination of the lower extremity soft tissues was performed in the area of clinical concern. COMPARISON:  None. FINDINGS: In the region of the left perineum, there is a 2.5 x 2.9 x 2.7 cm collection within the subcutaneous soft tissues. There are areas of ring down artifact. There is overlying skin thickening. There is extensive soft tissue swelling in the upper thigh. IMPRESSION: 1. In the region of the left perineum there is a 2.5 x 2.9 x 2.7 cm collection. This collection contains areas of ring down artifact suggestive of air and is highly concerning for an abscess or necrotizing infection such as Fournier's gangrene. Follow-up with a contrast enhanced CT is recommended. 2. Soft tissue edema and skin thickening is noted in the proximal left thigh consistent with cellulitis. These results  were called by telephone at the time of interpretation on 02/16/2020 at 10:37 pm to provider [facility] , who verbally acknowledged these results. Electronically Signed   By: Katherine Mantlehristopher  Green M.D.   On: 02/16/2020 22:37        Scheduled Meds: . insulin aspart  0-20 Units Subcutaneous TID WC  . insulin aspart  0-5 Units Subcutaneous  QHS  . insulin aspart  6 Units Subcutaneous TID WC  . insulin glargine  10 Units Subcutaneous Once  . [START ON 02/19/2020] insulin glargine  40 Units Subcutaneous Daily  . mupirocin ointment  1 application Nasal BID  . predniSONE  10 mg Oral Q breakfast  . rivaroxaban  20 mg Oral QAC supper   Continuous Infusions: . clindamycin (CLEOCIN) IV 600 mg (02/18/20 1313)  . meropenem (MERREM) IV 1 g (02/18/20 1233)  . vancomycin Stopped (02/18/20 0909)     LOS: 2 days    Time spent: 25 minutes    Alberteen Sam, MD [facility] 02/18/2020, 2:13 PM     Please page though AMION or Epic secure chat:  For [employer], Higher education careers adviser

## 2020-02-18 NOTE — Evaluation (Addendum)
Physical Therapy Evaluation/Discharge Patient Details Name: Noah Clayton MRN: [id number] DOB: May 28, 1975 Today's Date: 02/18/2020   History of Present Illness  Pt is a 44 y/o M presenting to ED on 11/27 for weakness and abscess in L groin. Found to have necrotizing infection and on 11/28 underwent surgical debridement. PMH of COVID (02/2019), DM, and HTN.  Clinical Impression  Pt shows modified independence with transfers, gait, and stair navigation that shows he is able to navigate the community independently. Pt was able navigate 4 stairs and ambulate more than household distances with no apparent deficits. Pt is safe to return home and does not need to be followed for skilled therapy in the hospital or at d/c due to functional mobility being at baseline. Pt is in agreeance with no further need from PT.    Follow Up Recommendations No PT follow up    Equipment Recommendations  None recommended by PT    Recommendations for Other Services       Precautions / Restrictions Precautions Precautions: None      Mobility  Bed Mobility               General bed mobility comments: not assessed, in chair on arrival and returned to chair at end of session    Transfers Overall transfer level: Modified independent               General transfer comment: pt able to perform sit to/from stand with increased time  Ambulation/Gait Ambulation/Gait assistance: Modified independent (Device/Increase time) Gait Distance (Feet): 450 Feet   Gait Pattern/deviations: WFL(Within Functional Limits)   Gait velocity interpretation: >2.62 ft/sec, indicative of community ambulatory General Gait Details: pt able to perform gait with increased time and no LOB  Stairs Stairs: Yes Stairs assistance: Modified independent (Device/Increase time) Stair Management: Two rails Number of Stairs: 4 General stair comments: pt able to navigate stairs with increased time  Wheelchair Mobility     Modified Rankin (Stroke Patients Only)       Balance Overall balance assessment: Independent                                           Pertinent Vitals/Pain Pain Assessment: No/denies pain    Home Living Family/patient expects to be discharged to:: Private residence Living Arrangements: Alone Available Help at Discharge: Friend(s);Available PRN/intermittently Type of Home: House Home Access: Stairs to enter Entrance Stairs-Rails: Can reach both Entrance Stairs-Number of Steps: 4 Home Layout: One level        Prior Function Level of Independence: Independent         Comments: cleans, drives, 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)     Hand Dominance        Extremity/Trunk Assessment   Upper Extremity Assessment Upper Extremity Assessment: Overall WFL for tasks assessed    Lower Extremity Assessment Lower Extremity Assessment: Overall WFL for tasks assessed    Cervical / Trunk Assessment Cervical / Trunk Assessment: Normal  Communication   Communication: No difficulties  Cognition Arousal/Alertness: Awake/alert Behavior During Therapy: WFL for tasks assessed/performed Overall Cognitive Status: Within Functional Limits for tasks assessed                                        General Comments  Exercises     Assessment/Plan    PT Assessment Patent does not need any further PT services  PT Problem List         PT Treatment Interventions      PT Goals (Current goals can be found in the Care Plan section)  Acute Rehab PT Goals Patient Stated Goal: go home PT Goal Formulation: With patient Time For Goal Achievement: 03/03/20 Potential to Achieve Goals: Good    Frequency     Barriers to discharge        Co-evaluation               AM-PAC PT "6 Clicks" Mobility  Outcome Measure Help needed turning from your back to your side while in a flat bed without using  bedrails?: None Help needed moving from lying on your back to sitting on the side of a flat bed without using bedrails?: None Help needed moving to and from a bed to a chair (including a wheelchair)?: None Help needed standing up from a chair using your arms (e.g., wheelchair or bedside chair)?: None Help needed to walk in hospital room?: None Help needed climbing 3-5 steps with a railing? : None 6 Click Score: 24    End of Session   Activity Tolerance: Patient tolerated treatment well Patient left: in chair   PT Visit Diagnosis: Difficulty in walking, not elsewhere classified (R26.2)    Time: 7681-1572 PT Time Calculation (min) (ACUTE ONLY): 15 min   Charges:   PT Evaluation $PT Eval Low Complexity: 1 Low          Ryaan Vanwagoner, SPT [id number] Ramir Malerba 02/18/2020, 10:12 AM

## 2020-02-19 ENCOUNTER — Other Ambulatory Visit (HOSPITAL_COMMUNITY): Payer: Self-pay | Admitting: Family Medicine

## 2020-02-19 LAB — GLUCOSE, CAPILLARY
Glucose-Capillary: 216 mg/dL — ABNORMAL HIGH (ref 70–99)
Glucose-Capillary: 262 mg/dL — ABNORMAL HIGH (ref 70–99)

## 2020-02-19 LAB — SURGICAL PATHOLOGY

## 2020-02-19 LAB — VANCOMYCIN, TROUGH: Vancomycin Tr: 7 ug/mL — ABNORMAL LOW (ref 15–20)

## 2020-02-19 MED ORDER — ACETAMINOPHEN 500 MG PO TABS: 1000.0000 mg | ORAL_TABLET | Freq: Four times a day (QID) | ORAL | Status: AC | PRN

## 2020-02-19 MED ORDER — OXYCODONE HCL 5 MG PO TABS
5.0000 mg | ORAL_TABLET | ORAL | 0 refills | Status: AC | PRN
Start: 2020-02-19 — End: 2021-02-18

## 2020-02-19 MED ORDER — CEPHALEXIN 500 MG PO CAPS: 500.0000 mg | ORAL_CAPSULE | Freq: Three times a day (TID) | ORAL | 0 refills | Status: DC

## 2020-02-19 MED ORDER — INSULIN LISPRO (1 UNIT DIAL) 100 UNIT/ML (KWIKPEN): 5.0000 [IU] | PEN_INJECTOR | Freq: Three times a day (TID) | SUBCUTANEOUS | 11 refills | Status: DC

## 2020-02-19 MED ORDER — INSULIN LISPRO (1 UNIT DIAL) 100 UNIT/ML (KWIKPEN)
5.0000 [IU] | PEN_INJECTOR | Freq: Three times a day (TID) | SUBCUTANEOUS | 11 refills | Status: DC
Start: 2020-02-19 — End: 2020-02-19

## 2020-02-19 MED ORDER — PEN NEEDLES 32G X 6 MM MISC: 1.0000 | Freq: Three times a day (TID) | 11 refills | Status: AC

## 2020-02-19 MED FILL — PENTIPS 32G X 4 MM MISC: 32G X 4 MM | 30 days supply | Qty: 100 | Fill #0

## 2020-02-19 MED FILL — HUMALOG 100 UNITS/ML KWIKPE: 100 | 30 days supply | Qty: 6 | Fill #0

## 2020-02-19 MED FILL — CEPHALEXIN 500 MG CAPS: 500 | 10 days supply | Qty: 30 | Fill #0

## 2020-02-19 NOTE — Discharge Summary (Signed)
Physician Discharge Summary  MARSHAUN LORTIE KCL:[id number] DOB: 02-Jul-1975 DOA: 02/16/2020  PCP: Renford Dills, MD  Admit date: 02/16/2020 Discharge date: 02/19/2020  Admitted From: Home  Disposition:  Home    Recommendations for Outpatient Follow-up:  1. Follow up with General Surgery in 2 weeks 2. Follow up with Dr. Nehemiah Settle in 1 week 3. Dr. Nehemiah Settle: Please review glucose log on new Humalog, and adjust as needed      Home Health: Unable to obtain  Equipment/Devices: None  Discharge Condition: Good  CODE STATUS: FULL Diet recommendation: Diabetic  Brief/Interim Summary: Mr. Garfinkle is a 44 y.o. 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  In the ER, patient tachycardic, tachypneic, febrile WBC 20K.  CT abdomen and pelvis showed a 2x7 likely necrotizing infection in the left anterior groin.  No FOurnier's on clinical exam.  Patient taken emergently to OR for debridement and started on empiric antibiotics.     PRINCIPAL HOSPITAL DIAGNOSIS: Sepsis due to necrotizing fasciitis    Discharge Diagnoses:   Sepsis due to necrotizing fasciitis S/p I&D by Dr. Laurell Josephs, 11/28 Patient presented with fever, leukocytosis and lactate 2.8 due to infection.  Taken to OR where I&D of necrotizing infection was performed, cultures sent.  Culture growing GBS.  Patient defervesced and wound appeared healthy without surrounding erythema or pockets of deeper infection.    Discharged to complete 14 days with cephalexin.   Hx Recurrent DVT Bilateral LE [place] here were negative for clot  Continue Xarelto  Diabetes, poorly controlled, with hyperglycemia Glucoses here hard to control.  Patient describes nonadherence to medicines at home, A1c 12%, feels this is a wake-up call.  Continue Lantus and metformin.  Add aspart TID AC.  Close PCP follow up.    Suspected IBD Continue prednisone, mesalamine  Hypertension  Obesity BMI 34  Hyponatremia                Discharge Instructions   Allergies as of 02/19/2020   No Known Allergies     Medication List    STOP taking these medications   glyBURIDE 5 MG tablet Commonly known as: DIABETA     TAKE these medications   acetaminophen 500 MG tablet Commonly known as: TYLENOL Take 2 tablets (1,000 mg total) by mouth every 6 (six) hours as needed for mild pain (or Fever >/= 101). What changed:   medication strength  how much to take  reasons to take this   atorvastatin 10 MG tablet Commonly known as: LIPITOR Take 10 mg by mouth daily.   cephALEXin 500 MG capsule Commonly known as: KEFLEX Take 1 capsule (500 mg total) by mouth 3 (three) times daily for 10 days.   glucose blood test strip Use as instructed   insulin glargine 100 unit/mL Sopn Commonly known as: LANTUS Inject 40 Units into the skin daily.   insulin lispro 100 UNIT/ML KwikPen Commonly known as: HumaLOG KwikPen Inject 5 Units into the skin 3 (three) times daily before meals.   mesalamine 1000 MG suppository Commonly known as: CANASA Place 1,000 mg rectally at bedtime.   metFORMIN 750 MG 24 hr tablet Commonly known as: GLUCOPHAGE-XR Take 750 mg by mouth daily with breakfast.   oxyCODONE 5 MG immediate release tablet Commonly known as: Roxicodone Take 1 tablet (5 mg total) by mouth every 4 (four) hours as needed.  Pen Needles 32G X 6 MM Misc 1 each by Does not apply route 3 (three) times daily before meals.   predniSONE 10 MG tablet Commonly known as: DELTASONE Take 10-40 mg by mouth See admin instructions. Filled 01/28/2020: take 4 tablets (40 mg) by mouth daily for one week, then take 3 tablets (30 mg) daily for one week, then take 2 tablets (20 mg) daily for one week, then take 1 tablet (10 mg) daily for one week, then stop   ramipril 10 MG  capsule Commonly known as: ALTACE Take 10 mg by mouth daily.   Rivaroxaban Stater Pack (15 mg and 20 mg) Commonly known as: XARELTO STARTER PACK Follow package directions: Take one  tablet by mouth twice a day. On day 22, switch to one  tablet once a day. Take with food. What changed:   how much to take  how to take this  when to take this       Follow-up Information    [facility], [facility] Follow up on 03/04/2020.   Specialty: General Surgery Why: 11 am, arrive by 10:30am for paperwork and check in process.  please bring insurance card and photo ID Contact information: [address] [place] [address] [phone]        Renford Dills, MD. Schedule an appointment as soon as possible for a visit in 1 week(s).   Specialty: Internal Medicine Contact information: [address] [place] [address] [phone]              No Known Allergies  Consultations:  General Surgery   Procedures/Studies: CT PELVIS W CONTRAST  Result Date: 02/17/2020 CLINICAL DATA:  Boil in the left groin for 5 days. Soft tissue infection is suspected. EXAM: CT PELVIS WITH CONTRAST TECHNIQUE: Multidetector CT imaging of the pelvis was performed using the standard protocol following the bolus administration of intravenous contrast. CONTRAST:  OMNIPAQUE IOHEXOL 300 MG/ML  SOLN COMPARISON:  06/22/2018 FINDINGS: Urinary Tract:  Bladder is normal.  Distal ureters are not dilated. Bowel: Visualized parts of the small and large bowel are normal. Appendix is normal. Vascular/Lymphatic: Minimal calcification in the iliac arteries. No aneurysm. Small pelvic lymph nodes are likely reactive. No significant lymphadenopathy. Reproductive:  Prostate gland is not enlarged. Other: Inflammatory stranding is demonstrated in the subcutaneous fat of the left groin, extending down along the anteromedial left thigh. In the anteromedial left thigh, there is a focal  collection, predominantly gas, measuring 2.2 x 7.7 cm in diameter. This is likely abscess and cellulitis. Fournier's gangrene may be present. Musculoskeletal: Visualized lower lumbar spine, sacrum, pelvis, and hips are unremarkable. No evidence of osteomyelitis. IMPRESSION: Inflammatory stranding in the subcutaneous fat of the left groin, extending down along the anteromedial left thigh. Focal predominately gas collection in the anteromedial left thigh, measuring 2.2 x 7.7 cm in diameter. This is likely abscess and cellulitis. Fournier's gangrene may be present. Electronically Signed   By: Burman Nieves M.D.   On: 02/17/2020 00:50   DG Chest Portable 1 View  Result Date: 02/16/2020 CLINICAL DATA:  Shortness of breath. EXAM: PORTABLE CHEST 1 VIEW COMPARISON:  March 24, 2016 FINDINGS: The heart size and mediastinal contours are within normal limits. Both lungs are clear. The visualized skeletal structures are unremarkable. IMPRESSION: No active disease. Electronically Signed   By: Aram Candela M.D.   On: 02/16/2020 18:01   [place] LT LOWER EXTREM LTD SOFT TISSUE NON VASCULAR  Result Date: 02/16/2020 CLINICAL  DATA:  Redness and swelling in the left groin.  Sepsis. EXAM: ULTRASOUND LEFT LOWER EXTREMITY LIMITED TECHNIQUE: Ultrasound examination of the lower extremity soft tissues was performed in the area of clinical concern. COMPARISON:  None. FINDINGS: In the region of the left perineum, there is a 2.5 x 2.9 x 2.7 cm collection within the subcutaneous soft tissues. There are areas of ring down artifact. There is overlying skin thickening. There is extensive soft tissue swelling in the upper thigh. IMPRESSION: 1. In the region of the left perineum there is a 2.5 x 2.9 x 2.7 cm collection. This collection contains areas of ring down artifact suggestive of air and is highly concerning for an abscess or necrotizing infection such as Fournier's gangrene. Follow-up with a contrast enhanced CT is recommended. 2.  Soft tissue edema and skin thickening is noted in the proximal left thigh consistent with cellulitis. These results were called by telephone at the time of interpretation on 02/16/2020 at 10:37 pm to provider [facility] , who verbally acknowledged these results. Electronically Signed   By: Katherine Mantle M.D.   On: 02/16/2020 22:37       Subjective: Feels well.  NO fever, constitutional symptoms.  No vomiting. No dizziness.    Discharge Exam: Vitals:   02/19/20 0500 02/19/20 1344  BP: 125/83 127/75  Pulse: 71 83  Resp: 17 18  Temp: 97.7 F (36.5 C) 98.1 F (36.7 C)  SpO2: 99% 100%   Vitals:   02/18/20 1635 02/18/20 2100 02/19/20 0500 02/19/20 1344  BP: 123/83 125/79 125/83 127/75  Pulse: 81 79 71 83  Resp: 16 20 17 18   Temp: 97.9 F (36.6 C) 98.1 F (36.7 C) 97.7 F (36.5 C) 98.1 F (36.7 C)  TempSrc: Oral Oral Oral   SpO2: 97% 97% 99% 100%  Weight:      Height:        General: Pt is alert, awake, not in acute distress Cardiovascular: RRR, nl S1-S2, no murmurs appreciated.   No LE edema.   Respiratory: Normal respiratory rate and rhythm.  CTAB without rales or wheezes. Abdominal: Abdomen soft and non-tender.  No distension or HSM.   Neuro/Psych: Strength symmetric in upper and lower extremities.  Judgment and insight appear normal.   The results of significant diagnostics from this hospitalization (including imaging, microbiology, ancillary and laboratory) are listed below for reference.     Microbiology: Recent Results (from the past 240 hour(s))  Resp Panel by RT-PCR (Flu A&B, Covid) Nasopharyngeal Swab     Status: None   Collection Time: 02/16/20  5:52 PM   Specimen: Nasopharyngeal Swab; Nasopharyngeal(NP) swabs in vial transport medium  Result Value Ref Range Status   SARS Coronavirus 2 by RT PCR NEGATIVE NEGATIVE Final    Comment: (NOTE) SARS-CoV-2 target nucleic acids are NOT DETECTED.  The SARS-CoV-2 RNA is generally detectable in upper  respiratory specimens during the acute phase of infection. The lowest concentration of SARS-CoV-2 viral copies this assay can detect is 138 copies/mL. A negative result does not preclude SARS-Cov-2 infection and should not be used as the sole basis for treatment or other patient management decisions. A negative result may occur with  improper specimen collection/handling, submission of specimen other than nasopharyngeal swab, presence of viral mutation(s) within the areas targeted by this assay, and inadequate number of viral copies(<138 copies/mL). A negative result must be combined with clinical observations, patient history, and epidemiological information. The expected result is Negative.  Fact Sheet for Patients:  02/18/20  Fact Sheet for Healthcare Providers:  SeriousBroker.it  This test is no t yet approved or cleared by the [place] FDA and  has been authorized for detection and/or diagnosis of SARS-CoV-2 by FDA under an Emergency Use Authorization (EUA). This EUA will remain  in effect (meaning this test can be used) for the duration of the COVID-19 declaration under Section 564(b)(1) of the Act, 21 U.S.C.section 360bbb-3(b)(1), unless the authorization is terminated  or revoked sooner.       Influenza A by PCR NEGATIVE NEGATIVE Final   Influenza B by PCR NEGATIVE NEGATIVE Final    Comment: (NOTE) The Xpert Xpress SARS-CoV-2/FLU/RSV plus assay is intended as an aid in the diagnosis of influenza from Nasopharyngeal swab specimens and should not be used as a sole basis for treatment. Nasal washings and aspirates are unacceptable for Xpert Xpress SARS-CoV-2/FLU/RSV testing.  Fact Sheet for Patients: BloggerCourse.com  Fact Sheet for Healthcare Providers: SeriousBroker.it  This test is not yet approved or cleared by the [place] FDA and has been  authorized for detection and/or diagnosis of SARS-CoV-2 by FDA under an Emergency Use Authorization (EUA). This EUA will remain in effect (meaning this test can be used) for the duration of the COVID-19 declaration under Section 564(b)(1) of the Act, 21 U.S.C. section 360bbb-3(b)(1), unless the authorization is terminated or revoked.  Performed at [facility], [address], [place], [place] [address]   Culture, blood (single)     Status: None (Preliminary result)   Collection Time: 02/16/20  8:07 PM   Specimen: BLOOD RIGHT ARM  Result Value Ref Range Status   Specimen Description BLOOD RIGHT ARM  Final   Special Requests   Final    BOTTLES DRAWN AEROBIC AND ANAEROBIC Blood Culture adequate volume   Culture   Final    NO GROWTH 3 DAYS Performed at [facility], [address] [place], [place] [address]    Report Status PENDING  Incomplete  Surgical PCR screen     Status: None   Collection Time: 02/17/20  1:50 AM   Specimen: Nasal Mucosa; Nasal Swab  Result Value Ref Range Status   MRSA, PCR NEGATIVE NEGATIVE Final   Staphylococcus aureus NEGATIVE NEGATIVE Final    Comment: (NOTE) The Xpert SA Assay (FDA approved for NASAL specimens in patients 8 years of age and older), is one component of a comprehensive surveillance program. It is not intended to diagnose infection nor to guide or monitor treatment. Performed at [facility], [address] [place], [place] [address]   Aerobic/Anaerobic Culture (surgical/deep wound)     Status: None (Preliminary result)   Collection Time: 02/17/20  3:47 AM   Specimen: Groin, Left; Abscess  Result Value Ref Range Status   Specimen Description ABSCESS LEFT GROIN  Final   Special Requests PATIENT ON FOLLOWING VANCOMYCIN,ROCEPHIN ID A  Final   Gram Stain   Final    FEW WBC PRESENT,BOTH PMN AND MONONUCLEAR ABUNDANT GRAM NEGATIVE RODS FEW GRAM POSITIVE COCCI IN PAIRS    Culture   Final    MODERATE GROUP B  STREP(S.AGALACTIAE)ISOLATED TESTING AGAINST S. AGALACTIAE NOT ROUTINELY PERFORMED DUE TO PREDICTABILITY OF AMP/PEN/VAN SUSCEPTIBILITY. HOLDING FOR POSSIBLE ANAEROBE Performed at [facility], [address], [place], [place] [address]    Report Status PENDING  Incomplete  Aerobic/Anaerobic Culture (surgical/deep wound)     Status: None (Preliminary result)   Collection Time: 02/17/20  4:05 AM   Specimen: Soft Tissue,  Other  Result Value Ref Range Status   Specimen Description TISSUE  Final   Special Requests NECROTIZING FASCITIS LEFT GROIN SPEC 1  Final   Gram Stain   Final    ABUNDANT WBC PRESENT,BOTH PMN AND MONONUCLEAR ABUNDANT GRAM NEGATIVE RODS ABUNDANT GRAM POSITIVE COCCI RARE GRAM VARIABLE ROD    Culture   Final    ABUNDANT GROUP B STREP(S.AGALACTIAE)ISOLATED TESTING AGAINST S. AGALACTIAE NOT ROUTINELY PERFORMED DUE TO PREDICTABILITY OF AMP/PEN/VAN SUSCEPTIBILITY. Performed at [facility], [address] [place], [place] [address]    Report Status PENDING  Incomplete     Labs: BNP (last 3 results) No results for input(s): BNP in the last 8760 hours. Basic Metabolic Panel: Recent Labs  Lab 02/16/20 1752 02/17/20 0130 02/18/20 0152  NA 130* 130* 132*  K 4.8 3.8 3.6  CL 93* 100 102  CO2 24 18* 24  GLUCOSE 386* 274* 264*  BUN CREATININE 1.12 0.98 0.84  CALCIUM 9.3 7.7* 8.1*   Liver Function Tests: Recent Labs  Lab 02/16/20 1752 02/17/20 0130  AST 15 11*  ALT 14 12  ALKPHOS 110 77  BILITOT 2.2* 2.2*  PROT 8.1 6.0*  ALBUMIN 3.2* 2.3*   No results for input(s): LIPASE, AMYLASE in the last 168 hours. No results for input(s): AMMONIA in the last 168 hours. CBC: Recent Labs  Lab 02/16/20 1752 02/17/20 0130 02/18/20 0152  WBC 20.5* 19.2* 14.0*  NEUTROABS 15.5* 14.3*  --   HGB 15.6 12.5* 11.7*  HCT 47.3 36.9* 35.6*  MCV 85.4 84.2 85.8  PLT 202 185 191   Cardiac Enzymes: No results for input(s): CKTOTAL, CKMB, CKMBINDEX,  TROPONINI in the last 168 hours. BNP: Invalid input(s): POCBNP CBG: Recent Labs  Lab 02/18/20 1124 02/18/20 1633 02/18/20 2035 02/19/20 0855 02/19/20 1150  GLUCAP 271* 211* 242* 216* 262*   D-Dimer No results for input(s): DDIMER in the last 72 hours. Hgb A1c Recent Labs    02/16/20 2225  HGBA1C 12.5*   Lipid Profile No results for input(s): CHOL, HDL, LDLCALC, TRIG, CHOLHDL, LDLDIRECT in the last 72 hours. Thyroid function studies No results for input(s): TSH, T4TOTAL, T3FREE, THYROIDAB in the last 72 hours.  Invalid input(s): FREET3 Anemia work up No results for input(s): VITAMINB12, FOLATE, FERRITIN, TIBC, IRON, RETICCTPCT in the last 72 hours. Urinalysis    Component Value Date/Time   COLORURINE YELLOW 06/22/2018 1320   APPEARANCEUR CLEAR 06/22/2018 1320   LABSPEC 1.029 06/22/2018 1320   PHURINE 5.0 06/22/2018 1320   GLUCOSEU NEGATIVE 06/22/2018 1320   HGBUR NEGATIVE 06/22/2018 1320   BILIRUBINUR NEGATIVE 06/22/2018 1320   KETONESUR NEGATIVE 06/22/2018 1320   PROTEINUR 30 (A) 06/22/2018 1320   UROBILINOGEN 0.2 09/30/2012 1937   NITRITE NEGATIVE 06/22/2018 1320   LEUKOCYTESUR NEGATIVE 06/22/2018 1320   Sepsis Labs Invalid input(s): PROCALCITONIN,  WBC,  LACTICIDVEN Microbiology Recent Results (from the past 240 hour(s))  Resp Panel by RT-PCR (Flu A&B, Covid) Nasopharyngeal Swab     Status: None   Collection Time: 02/16/20  5:52 PM   Specimen: Nasopharyngeal Swab; Nasopharyngeal(NP) swabs in vial transport medium  Result Value Ref Range Status   SARS Coronavirus 2 by RT PCR NEGATIVE NEGATIVE Final    Comment: (NOTE) SARS-CoV-2 target nucleic acids are NOT DETECTED.  The SARS-CoV-2 RNA is generally detectable in upper respiratory specimens during the acute phase of infection. The lowest concentration of SARS-CoV-2 viral copies this assay can detect is 138 copies/mL. A negative result  does not preclude SARS-Cov-2 infection and should not be used as the  sole basis for treatment or other patient management decisions. A negative result may occur with  improper specimen collection/handling, submission of specimen other than nasopharyngeal swab, presence of viral mutation(s) within the areas targeted by this assay, and inadequate number of viral copies(<138 copies/mL). A negative result must be combined with clinical observations, patient history, and epidemiological information. The expected result is Negative.  Fact Sheet for Patients:  BloggerCourse.com  Fact Sheet for Healthcare Providers:  SeriousBroker.it  This test is no t yet approved or cleared by the [place] FDA and  has been authorized for detection and/or diagnosis of SARS-CoV-2 by FDA under an Emergency Use Authorization (EUA). This EUA will remain  in effect (meaning this test can be used) for the duration of the COVID-19 declaration under Section 564(b)(1) of the Act, 21 U.S.C.section 360bbb-3(b)(1), unless the authorization is terminated  or revoked sooner.       Influenza A by PCR NEGATIVE NEGATIVE Final   Influenza B by PCR NEGATIVE NEGATIVE Final    Comment: (NOTE) The Xpert Xpress SARS-CoV-2/FLU/RSV plus assay is intended as an aid in the diagnosis of influenza from Nasopharyngeal swab specimens and should not be used as a sole basis for treatment. Nasal washings and aspirates are unacceptable for Xpert Xpress SARS-CoV-2/FLU/RSV testing.  Fact Sheet for Patients: BloggerCourse.com  Fact Sheet for Healthcare Providers: SeriousBroker.it  This test is not yet approved or cleared by the [place] FDA and has been authorized for detection and/or diagnosis of SARS-CoV-2 by FDA under an Emergency Use Authorization (EUA). This EUA will remain in effect (meaning this test can be used) for the duration of the COVID-19 declaration under Section 564(b)(1) of the  Act, 21 U.S.C. section 360bbb-3(b)(1), unless the authorization is terminated or revoked.  Performed at [facility], [address], [place], [place] [address]   Culture, blood (single)     Status: None (Preliminary result)   Collection Time: 02/16/20  8:07 PM   Specimen: BLOOD RIGHT ARM  Result Value Ref Range Status   Specimen Description BLOOD RIGHT ARM  Final   Special Requests   Final    BOTTLES DRAWN AEROBIC AND ANAEROBIC Blood Culture adequate volume   Culture   Final    NO GROWTH 3 DAYS Performed at [facility], [address] [place], [place] [address]    Report Status PENDING  Incomplete  Surgical PCR screen     Status: None   Collection Time: 02/17/20  1:50 AM   Specimen: Nasal Mucosa; Nasal Swab  Result Value Ref Range Status   MRSA, PCR NEGATIVE NEGATIVE Final   Staphylococcus aureus NEGATIVE NEGATIVE Final    Comment: (NOTE) The Xpert SA Assay (FDA approved for NASAL specimens in patients 41 years of age and older), is one component of a comprehensive surveillance program. It is not intended to diagnose infection nor to guide or monitor treatment. Performed at [facility], [address], [place], [place] [address]   Aerobic/Anaerobic Culture (surgical/deep wound)     Status: None (Preliminary result)   Collection Time: 02/17/20  3:47 AM   Specimen: Groin, Left; Abscess  Result Value Ref Range Status   Specimen Description ABSCESS LEFT GROIN  Final   Special Requests PATIENT ON FOLLOWING VANCOMYCIN,ROCEPHIN ID A  Final   Gram Stain   Final    FEW WBC PRESENT,BOTH PMN AND MONONUCLEAR ABUNDANT GRAM NEGATIVE RODS FEW  GRAM POSITIVE COCCI IN PAIRS    Culture   Final    MODERATE GROUP B STREP(S.AGALACTIAE)ISOLATED TESTING AGAINST S. AGALACTIAE NOT ROUTINELY PERFORMED DUE TO PREDICTABILITY OF AMP/PEN/VAN SUSCEPTIBILITY. HOLDING FOR POSSIBLE ANAEROBE Performed at [facility], [address] [place], [place] [address]    Report  Status PENDING  Incomplete  Aerobic/Anaerobic Culture (surgical/deep wound)     Status: None (Preliminary result)   Collection Time: 02/17/20  4:05 AM   Specimen: Soft Tissue, Other  Result Value Ref Range Status   Specimen Description TISSUE  Final   Special Requests NECROTIZING FASCITIS LEFT GROIN SPEC 1  Final   Gram Stain   Final    ABUNDANT WBC PRESENT,BOTH PMN AND MONONUCLEAR ABUNDANT GRAM NEGATIVE RODS ABUNDANT GRAM POSITIVE COCCI RARE GRAM VARIABLE ROD    Culture   Final    ABUNDANT GROUP B STREP(S.AGALACTIAE)ISOLATED TESTING AGAINST S. AGALACTIAE NOT ROUTINELY PERFORMED DUE TO PREDICTABILITY OF AMP/PEN/VAN SUSCEPTIBILITY. Performed at [facility], [address] [place], [place] [address]    Report Status PENDING  Incomplete     Time coordinating discharge: 25 minutes       SIGNED:   Alberteen Samhristopher P Virgel Haro, MD  [facility] 02/19/2020, 8:30 PM

## 2020-02-19 NOTE — Plan of Care (Signed)
  Problem: Education: Goal: Knowledge of General Education information will improve Description Including pain rating scale, medication(s)/side effects and non-pharmacologic comfort measures Outcome: Progressing   Problem: Health Behavior/Discharge Planning: Goal: Ability to manage health-related needs will improve Outcome: Progressing   

## 2020-02-19 NOTE — TOC Transition Note (Signed)
Transition of Care [facility]) - CM/SW Discharge Note   Patient Details  Name: LEONA PRESSLY MRN: [id number] Date of Birth: Feb 28, 1976  [facility] CM/SW Contact:  Beckie Busing, RN Phone Number: [phone]  02/19/2020, 1:17 PM   Clinical Narrative:    CM consulted to set up home health for patient discharging home and needing dressing changes. CM has reached out to numerous [facility] agencies with no success. No agency is able to accept due to being out of network with his insurance. Dr. Maryfrances Bunnell,  Barnetta Chapel PA and primary nurse have been made aware. Nurse to provide and document wound care teaching. No further needs noted at this time CM will sign off.     Final next level of care: Home/Self Care Barriers to Discharge: No Home Care Agency will accept this patient   Patient Goals and CMS Choice Patient states their goals for this hospitalization and ongoing recovery are:: Patient states he is ready to go home   Choice offered to / list presented to : NA  Discharge Placement                       Discharge Plan and Services In-house Referral: NA Discharge Planning Services: CM Consult Post Acute Care Choice: Home Health          DME Arranged: N/A DME Agency: NA       HH Arranged: NA          Social Determinants of Health (SDOH) Interventions     Readmission Risk Interventions No flowsheet data found.

## 2020-02-19 NOTE — Progress Notes (Signed)
Inpatient Diabetes Program Recommendations  AACE/ADA: New Consensus Statement on Inpatient Glycemic Control (2015)  Target Ranges:  Prepandial:   less than 140 mg/dL      Peak postprandial:   less than 180 mg/dL (1-2 hours)      Critically ill patients:  140 - 180 mg/dL   Results for Noah Clayton, Noah Clayton (MRN [id number]) as of 02/19/2020 12:11  Ref. Range 02/18/2020 07:29 02/18/2020 11:24 02/18/2020 16:33 02/18/2020 20:35  Glucose-Capillary Latest Ref Range: 70 - 99 mg/dL 419 (H)  11 units NOVOLOG  26 units LANTUS  271 (H)  15 units NOVOLOG  211 (H)  13 units NOVOLOG  10 units LANTUS  242 (H)  2 units NOVOLOG    Results for Noah Clayton, Noah Clayton (MRN [id number]) as of 02/19/2020 12:11  Ref. Range 02/19/2020 08:55 02/19/2020 11:50  Glucose-Capillary Latest Ref Range: 70 - 99 mg/dL 211 (H)  13 units NOVOLOG  40 units LANTUS 262 (H)   Home DM Meds: Lantus 40 units Daily                             Glyburide 5 mg Daily                             Metformin 750 mg Daily   Current Orders: Lantus 40 units Daily                            Novolog Resistant Correction Scale/ SSI (0-20 units) TID AC + HS                            Novolog 6 units TID with meals   MD- Note that Lantus increased to 40 units Daily today  CBGs remain elevated  Please consider:  1. Increase Lantus slightly more to 45 units Daily  2. Increase Novolog Meal Coverage to 10 units TID with meals (has been getting over 10 units Novolog with meals from combo of SSI and meal coverage)  Is willing to escalate insulin therapy at home and is willing to take Novolog with meals if needed to help improve his A1c and improve his health.  May consider at time of discharge home, stopping the Glyburide and giving pt a Rx for Novolog SSI to use TID before meals at home until he can follow up with his PCP.  Pt would like to keep his Metformin at 750 mg Daily due to stomach upset with increased Metformin  dose.     --Will follow patient during hospitalization--  Ambrose Finland RN, MSN, CDE Diabetes Coordinator Inpatient Glycemic Control Team Team Pager: [phone] (8a-5p)

## 2020-02-19 NOTE — Plan of Care (Signed)
  Problem: Education: Goal: Knowledge of General Education information will improve Description: Including pain rating scale, medication(s)/side effects and non-pharmacologic comfort measures 02/19/2020 1346 by Genevie Ann, RN Outcome: Adequate for Discharge 02/19/2020 0805 by Genevie Ann, RN Outcome: Progressing   Problem: Health Behavior/Discharge Planning: Goal: Ability to manage health-related needs will improve 02/19/2020 1346 by Genevie Ann, RN Outcome: Adequate for Discharge 02/19/2020 0805 by Genevie Ann, RN Outcome: Progressing   Problem: Clinical Measurements: Goal: Ability to maintain clinical measurements within normal limits will improve Outcome: Adequate for Discharge Goal: Will remain free from infection Outcome: Adequate for Discharge Goal: Diagnostic test results will improve Outcome: Adequate for Discharge Goal: Respiratory complications will improve Outcome: Adequate for Discharge Goal: Cardiovascular complication will be avoided Outcome: Adequate for Discharge   Problem: Activity: Goal: Risk for activity intolerance will decrease Outcome: Adequate for Discharge   Problem: Nutrition: Goal: Adequate nutrition will be maintained Outcome: Adequate for Discharge   Problem: Coping: Goal: Level of anxiety will decrease Outcome: Adequate for Discharge   Problem: Elimination: Goal: Will not experience complications related to bowel motility Outcome: Adequate for Discharge Goal: Will not experience complications related to urinary retention Outcome: Adequate for Discharge   Problem: Pain Managment: Goal: General experience of comfort will improve Outcome: Adequate for Discharge   Problem: Safety: Goal: Ability to remain free from injury will improve Outcome: Adequate for Discharge   Problem: Skin Integrity: Goal: Risk for impaired skin integrity will decrease Outcome: Adequate for Discharge   Problem: Education: Goal:  Required Educational Video(s) Outcome: Adequate for Discharge   Problem: Clinical Measurements: Goal: Postoperative complications will be avoided or minimized Outcome: Adequate for Discharge   Problem: Skin Integrity: Goal: Demonstration of wound healing without infection will improve Outcome: Adequate for Discharge

## 2020-02-19 NOTE — Progress Notes (Signed)
Patient discharged home, left floor via wheelchair accompanied by staff. No c/o pain or discomfort at time of discharge.  Shawne Bulow, Kae Heller, RN

## 2020-02-19 NOTE — Progress Notes (Signed)
Instructed patient in how to do wet to dry dressing, had patient return demonstrate he could do it himself before discharge. Patient was able to change dressing properly but it was very challenging for him. Told patient may be helpful to use a mirror. Provided patient with some supplies to get started, case mang tried to set up home health but was unable to because of insurance.  Discharge instructions, RX's and follow up appts explained and provided to patient, patient verbalized understanding.

## 2020-02-19 NOTE — Progress Notes (Signed)
2 Days Post-Op  Subjective: Patient still with some pain but controlled.  Dressing change done overnight.    ROS: See above, otherwise other systems negative  Objective: Vital signs in last 24 hours: Temp:  [97.7 F (36.5 C)-98.1 F (36.7 C)] 97.7 F (36.5 C) (11/30 0500) Pulse Rate:  [71-81] 71 (11/30 0500) Resp:  [16-20] 17 (11/30 0500) BP: (123-125)/(79-83) 125/83 (11/30 0500) SpO2:  [97 %-99 %] 99 % (11/30 0500) Last BM Date: 02/16/20  Intake/Output from previous day: 11/29 0701 - 11/30 0700 In: 2610.4 [P.O.:360; IV Piggyback:2250.4] Out: -  Intake/Output this shift: No intake/output data recorded.  PE: Skin: left groin wound is clean and packed.  No further purulent drainage and erythema of thigh continues to improve and is essentially resolved this am.    Lab Results:  Recent Labs    02/17/20 0130 02/18/20 0152  WBC 19.2* 14.0*  HGB 12.5* 11.7*  HCT 36.9* 35.6*  PLT 185 191   BMET Recent Labs    02/17/20 0130 02/18/20 0152  NA 130* 132*  K 3.8 3.6  CL 100 102  CO2 18* 24  GLUCOSE 274* 264*  BUN 13 9  CREATININE 0.98 0.84  CALCIUM 7.7* 8.1*   PT/INR Recent Labs    02/17/20 0130  LABPROT 16.3*  INR 1.4*   CMP     Component Value Date/Time   NA 132 (L) 02/18/2020 0152   K 3.6 02/18/2020 0152   CL 102 02/18/2020 0152   CO2 24 02/18/2020 0152   GLUCOSE 264 (H) 02/18/2020 0152   BUN 9 02/18/2020 0152   CREATININE 0.84 02/18/2020 0152   CALCIUM 8.1 (L) 02/18/2020 0152   PROT 6.0 (L) 02/17/2020 0130   ALBUMIN 2.3 (L) 02/17/2020 0130   AST 11 (L) 02/17/2020 0130   ALT 12 02/17/2020 0130   ALKPHOS 77 02/17/2020 0130   BILITOT 2.2 (H) 02/17/2020 0130   GFRNONAA >60 02/18/2020 0152   GFRAA >60 06/22/2018 1239   Lipase     Component Value Date/Time   LIPASE 157 (H) 06/22/2018 1239       Studies/Results: No results found.  Anti-infectives: Anti-infectives (From admission, onward)   Start     Dose/Rate Route Frequency Ordered  Stop   02/17/20 1000  vancomycin (VANCOCIN) IVPB 1000 mg/200 mL premix        1,000 mg 200 mL/hr over 60 Minutes Intravenous Every 8 hours 02/17/20 0121     02/17/20 0600  clindamycin (CLEOCIN) IVPB 600 mg        600 mg 100 mL/hr over 30 Minutes Intravenous Every 8 hours 02/17/20 0108     02/17/20 0600  meropenem (MERREM) 1 g in sodium chloride 0.9 % 100 mL IVPB        1 g 200 mL/hr over 30 Minutes Intravenous Every 8 hours 02/17/20 0111     02/16/20 2200  vancomycin (VANCOREADY) IVPB 2000 mg/400 mL        2,000 mg 200 mL/hr over 120 Minutes Intravenous  Once 02/16/20 2157 02/17/20 0145   02/16/20 2200  ceFEPIme (MAXIPIME) 2 g in sodium chloride 0.9 % 100 mL IVPB  Status:  Discontinued        2 g 200 mL/hr over 30 Minutes Intravenous Every 8 hours 02/16/20 2157 02/17/20 0108   02/16/20 1930  cefTRIAXone (ROCEPHIN) 2 g in sodium chloride 0.9 % 100 mL IVPB  Status:  Discontinued        2 g 200 mL/hr over 30  Minutes Intravenous Every 24 hours 02/16/20 1919 02/16/20 2157       Assessment/Plan DM HTN 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 Janee Morn on 11/28 -dressing changes.  RN to teach patient how to do his own dressing change if unable to get [facility].  TOC working on [facility] -cx reveals group B strep.  Can transition to oral abx for a total of 10-14 days given his DM -stable for DC home today from surgical standpoint.  He has follow up made in our office.  I have sent his narcotics to his pharmacy for him.    FEN - carb mod diet VTE - xarelto ID - clinda, merrem, vanc   LOS: 3 days    Letha Cape , [facility] 02/19/2020, 8:23 AM Please see Amion for pager number during day hours 7:00am-4:30pm or 7:00am -11:30am on weekends

## 2020-02-21 LAB — CULTURE, BLOOD (SINGLE)
Culture: NO GROWTH
Special Requests: ADEQUATE

## 2020-02-21 NOTE — Anesthesia Postprocedure Evaluation (Signed)
Anesthesia Post Note  Patient: DORSEY AUTHEMENT  Procedure(s) Performed: INCISION AND DRAINAGE, GROIN (Left Groin)     Patient location during evaluation: PACU Anesthesia Type: General Level of consciousness: awake and alert Pain management: pain level controlled Vital Signs Assessment: post-procedure vital signs reviewed and stable Respiratory status: spontaneous breathing, nonlabored ventilation, respiratory function stable and patient connected to nasal cannula oxygen Cardiovascular status: blood pressure returned to baseline and stable Postop Assessment: no apparent nausea or vomiting Anesthetic complications: no   No complications documented.  Last Vitals:  Vitals:   02/19/20 0500 02/19/20 1344  BP: 125/83 127/75  Pulse: 71 83  Resp: 17 18  Temp: 36.5 C 36.7 C  SpO2: 99% 100%    Last Pain:  Vitals:   02/19/20 1420  TempSrc:   PainSc: 2                  Sakara Lehtinen

## 2020-02-23 LAB — AEROBIC/ANAEROBIC CULTURE W GRAM STAIN (SURGICAL/DEEP WOUND)

## 2021-04-19 IMAGING — CT CT ABD-PELV W/ CM
1 of 3 series · 14 of 32 positions shown, 19 images · IV contrast (APPLIED)
Comparison: 06/22/2018

CLINICAL DATA: Abdominal pain for 3 weeks.  Hematochezia.

EXAM:
CT ABDOMEN AND PELVIS WITH CONTRAST
TECHNIQUE: Multidetector CT imaging of the abdomen and pelvis was performed
using the standard protocol following bolus administration of
intravenous contrast.
CONTRAST:  100mL FXPP9L-Q99 IOPAMIDOL (FXPP9L-Q99) INJECTION 61%

[Series 2: abd/pelvis w/cm · axial · 0.85mm/px · z∈[-477,-37]mm · 14 of 102 slices shown, 19 images]
[im 7/102  soft-tissue]
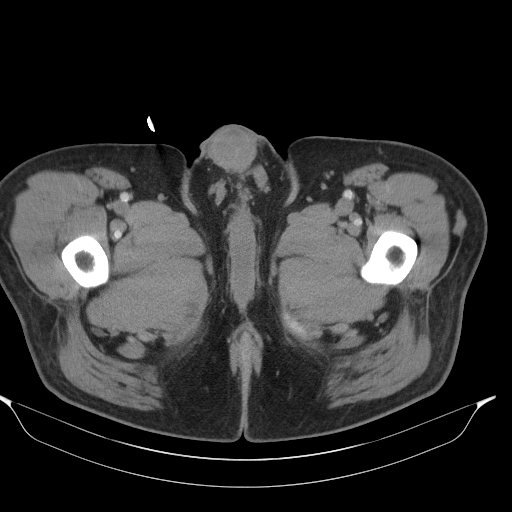
[im 7/102  bone]
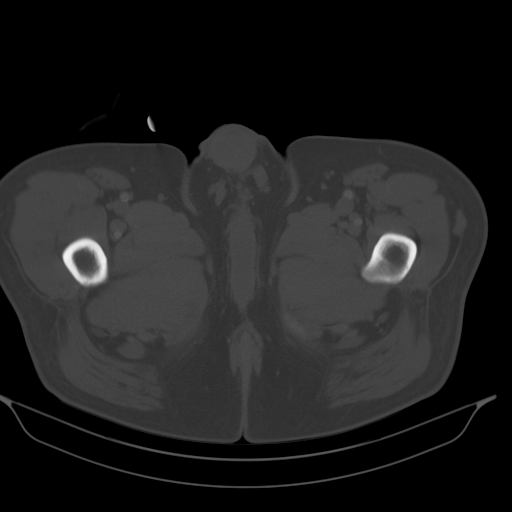
[im 13/102  soft-tissue]
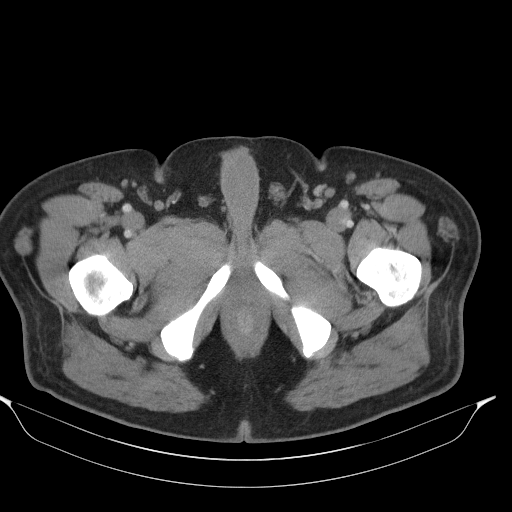
[im 19/102  soft-tissue]
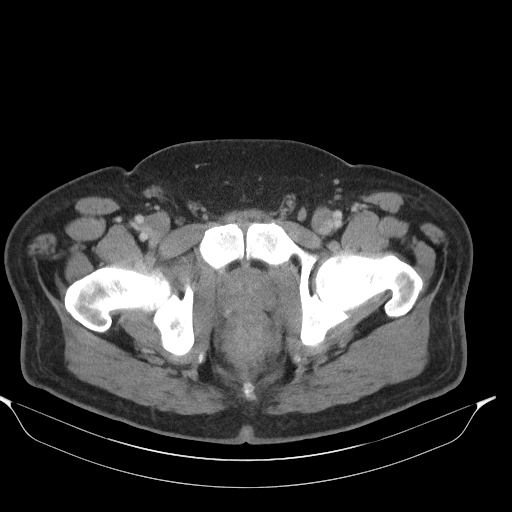
[im 32/102  soft-tissue]
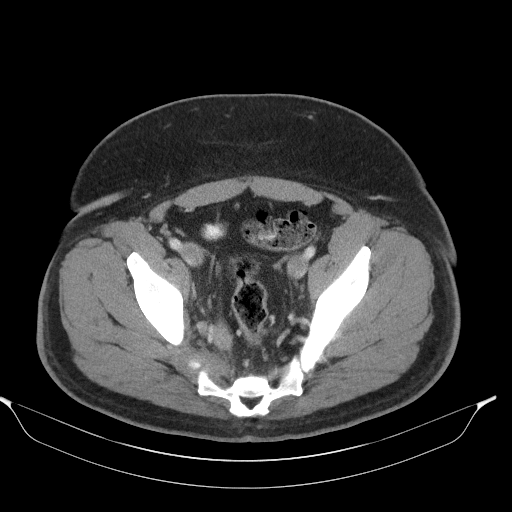
[im 38/102  soft-tissue]
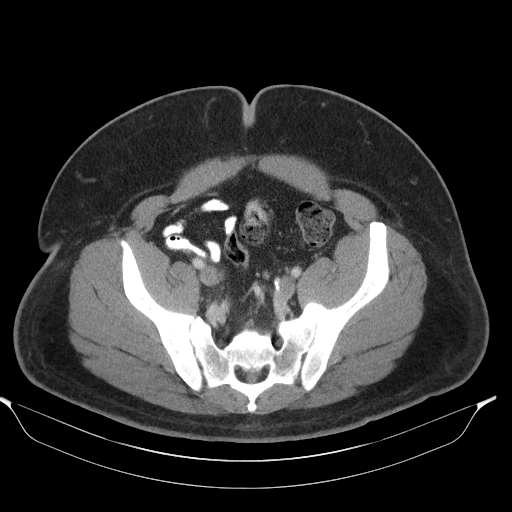
[im 45/102  soft-tissue]
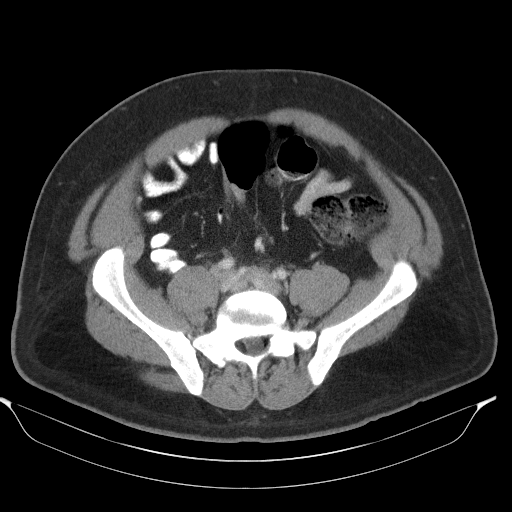
[im 51/102  soft-tissue]
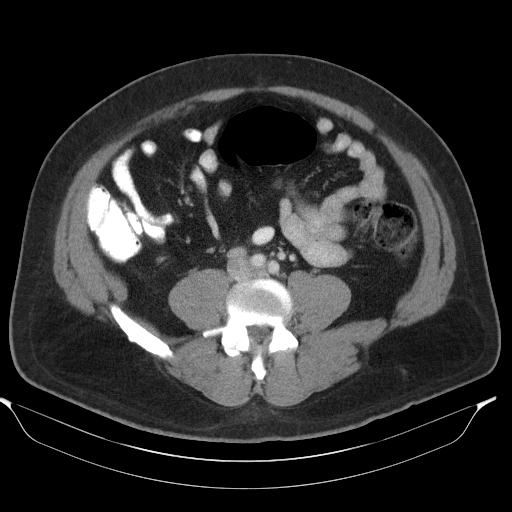
[im 57/102  soft-tissue]
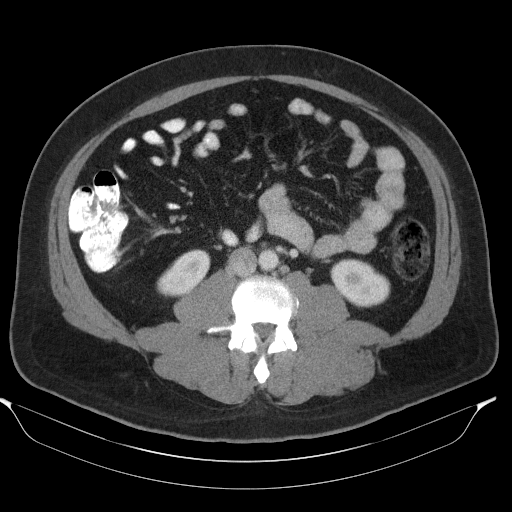
[im 64/102  soft-tissue]
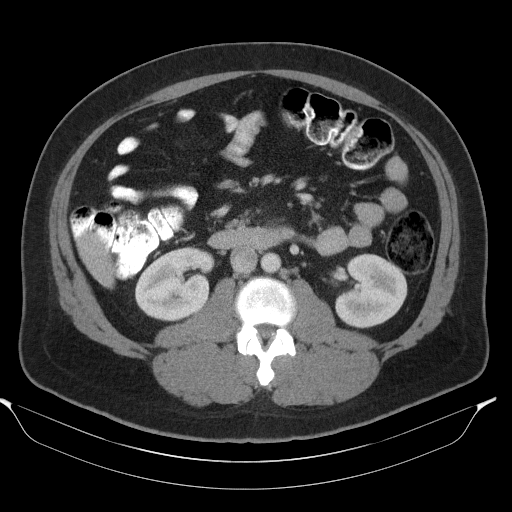
[im 64/102  bone]
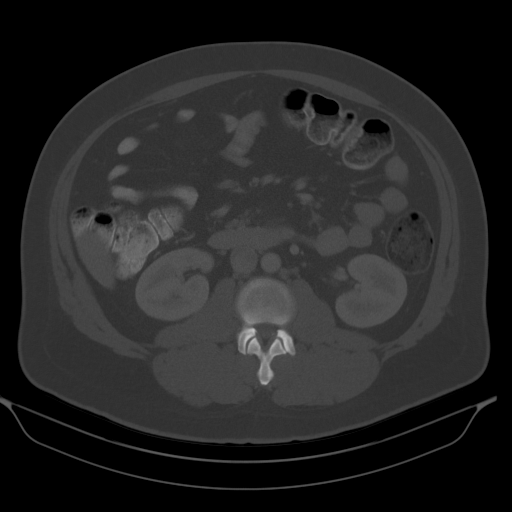
[im 70/102  soft-tissue]
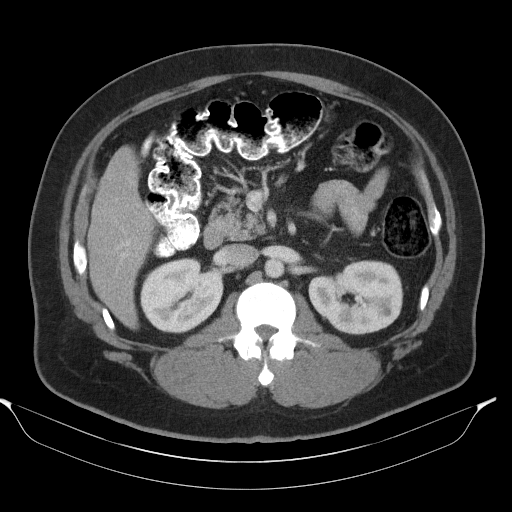
[im 76/102  lung]
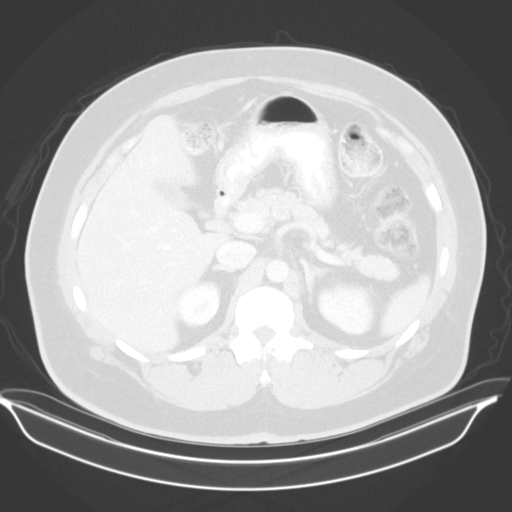
[im 83/102  soft-tissue]
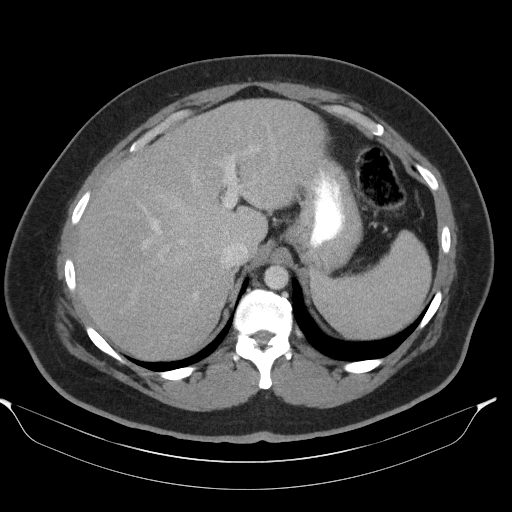
[im 83/102  lung]
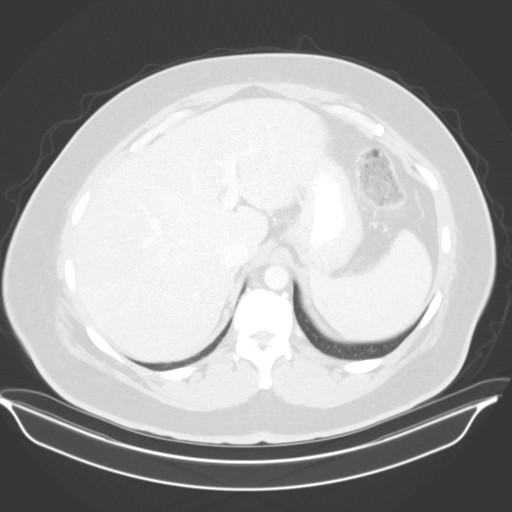
[im 89/102  soft-tissue]
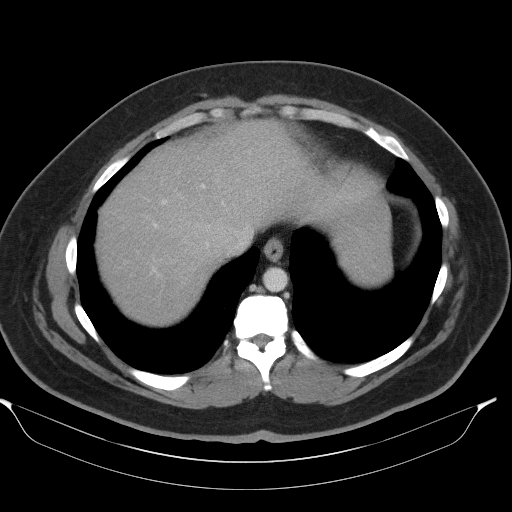
[im 89/102  lung]
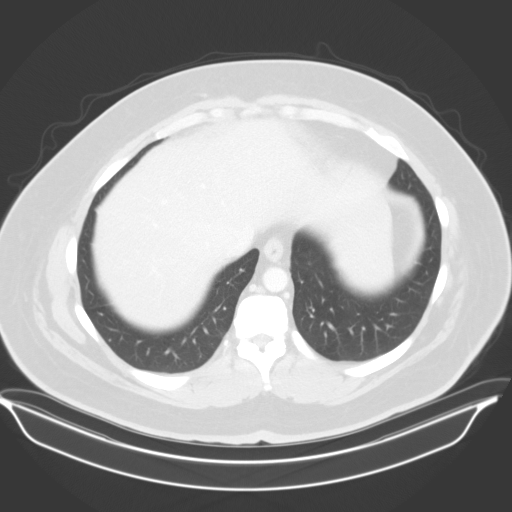
[im 95/102  soft-tissue]
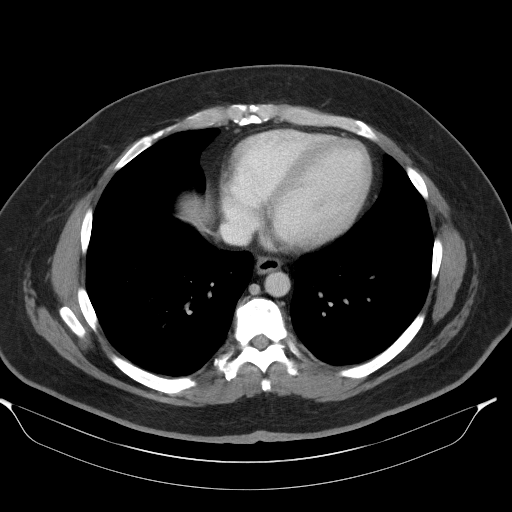
[im 95/102  lung]
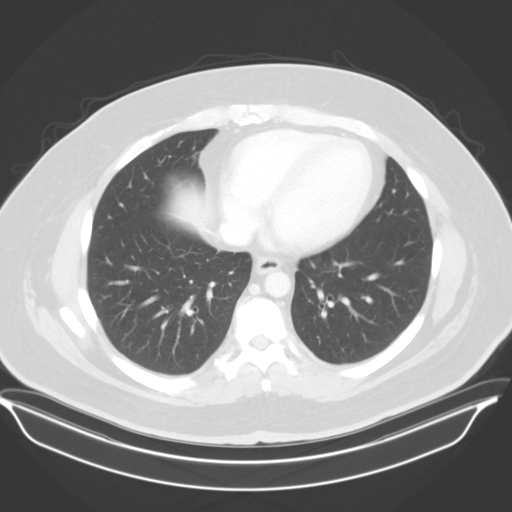

[14 of 32 positions shown; findings below may reference images not displayed]

FINDINGS: Lower Chest: No acute findings.

Hepatobiliary: No hepatic masses identified. Gallbladder is
unremarkable. No evidence of biliary ductal dilatation.

Pancreas:  No mass or inflammatory changes.

Spleen: Within normal limits in size and appearance.

Adrenals/Urinary Tract: No masses identified. 2 mm calculus noted in
lower pole of right kidney. No evidence of ureteral calculi or
hydronephrosis.

Stomach/Bowel: Short segment concentric wall thickening is seen in
the lower rectum, highly suspicious for rectal carcinoma. No
evidence of bowel obstruction, inflammatory process, or abnormal
fluid collections.

Vascular/Lymphatic: Adjacent mesorectal lymphadenopathy is seen
bilaterally, with largest lymph node measuring 1.6 cm. Extra
mesorectal lymphadenopathy is seen in the right internal, external,
and common iliac chains, with largest lymph node in the right
internal iliac region measuring 1.8 cm. Several small approximately
1 cm retroperitoneal lymph nodes are also seen in the inferior
aortocaval and left paraaortic regions. No abdominal aortic
aneurysm.

Reproductive:  No mass or other significant abnormality.

Other:  None.

Musculoskeletal:  No suspicious bone lesions identified.
IMPRESSION: Short-segment concentric wall thickening in lower rectum, highly
suspicious for rectal carcinoma.

Mesorectal, right iliac, and and lower abdominal retroperitoneal
lymphadenopathy, consistent with metastatic disease.

These results will be called to the ordering clinician or
representative by the Radiologist Assistant, and communication
documented in the PACS or [REDACTED].

## 2021-04-28 ENCOUNTER — Emergency Department (HOSPITAL_BASED_OUTPATIENT_CLINIC_OR_DEPARTMENT_OTHER)
Admission: EM | Admit: 2021-04-28 | Discharge: 2021-04-28 | Disposition: A | Discharge status: Home / Self Care | Payer: BC Managed Care – PPO | Attending: Emergency Medicine | Admitting: Emergency Medicine

## 2021-04-28 ENCOUNTER — Other Ambulatory Visit: Payer: Self-pay

## 2021-04-28 ENCOUNTER — Encounter (HOSPITAL_BASED_OUTPATIENT_CLINIC_OR_DEPARTMENT_OTHER): Payer: Self-pay

## 2021-04-28 DIAGNOSIS — G63 Polyneuropathy in diseases classified elsewhere: Secondary | ICD-10-CM

## 2021-04-28 DIAGNOSIS — Z794 Long term (current) use of insulin: Secondary | ICD-10-CM | POA: Insufficient documentation

## 2021-04-28 DIAGNOSIS — I10 Essential (primary) hypertension: Secondary | ICD-10-CM | POA: Insufficient documentation

## 2021-04-28 DIAGNOSIS — E1142 Type 2 diabetes mellitus with diabetic polyneuropathy: Secondary | ICD-10-CM | POA: Diagnosis not present

## 2021-04-28 DIAGNOSIS — Z7984 Long term (current) use of oral hypoglycemic drugs: Secondary | ICD-10-CM | POA: Insufficient documentation

## 2021-04-28 DIAGNOSIS — R200 Anesthesia of skin: Secondary | ICD-10-CM | POA: Diagnosis present

## 2021-04-28 HISTORY — DX: Acute embolism and thrombosis of unspecified deep veins of unspecified lower extremity: I82.409

## 2021-04-28 LAB — BASIC METABOLIC PANEL
Anion gap: 9 (ref 5–15)
BUN: 17 mg/dL (ref 6–20)
CO2: 24 mmol/L (ref 22–32)
Calcium: 9.3 mg/dL (ref 8.9–10.3)
Chloride: 100 mmol/L (ref 98–111)
Creatinine, Ser: 0.95 mg/dL (ref 0.61–1.24)
GFR, Estimated: >60 mL/min (ref >60)
Glucose, Bld: 268 mg/dL — ABNORMAL HIGH (ref 70–99)
Potassium: 3.7 mmol/L (ref 3.5–5.1)
Sodium: 133 mmol/L — ABNORMAL LOW (ref 135–145)

## 2021-04-28 LAB — CBC WITH DIFFERENTIAL/PLATELET
Abs Immature Granulocytes: 0.03 10*3/uL (ref 0.00–0.07)
Basophils Absolute: 0.1 10*3/uL (ref 0.0–0.1)
Basophils Relative: 1 %
Eosinophils Absolute: 0.1 10*3/uL (ref 0.0–0.5)
Eosinophils Relative: 1 %
HCT: 46.7 % (ref 39.0–52.0)
Hemoglobin: 15.8 g/dL (ref 13.0–17.0)
Immature Granulocytes: 0 %
Lymphocytes Relative: 41 %
Lymphs Abs: 3.9 10*3/uL (ref 0.7–4.0)
MCH: 29.8 pg (ref 26.0–34.0)
MCHC: 33.8 g/dL (ref 30.0–36.0)
MCV: 88.1 fL (ref 80.0–100.0)
Monocytes Absolute: 0.9 10*3/uL (ref 0.1–1.0)
Monocytes Relative: 9 %
Neutro Abs: 4.5 10*3/uL (ref 1.7–7.7)
Neutrophils Relative %: 48 %
Platelets: 263 10*3/uL (ref 150–400)
RBC: 5.3 MIL/uL (ref 4.22–5.81)
RDW: 13.5 % (ref 11.5–15.5)
WBC: 9.4 10*3/uL (ref 4.0–10.5)
nRBC: 0 % (ref 0.0–0.2)

## 2021-04-28 NOTE — ED Triage Notes (Signed)
Pt reports progressing left foot numbness for one month; hx of diabetes. Ambulatory at triage with independent steady gait

## 2021-04-28 NOTE — ED Provider Notes (Signed)
[facility] EMERGENCY DEPARTMENT Provider Note   CSN: [id number] Arrival date & time: 04/28/21  1903     History  Chief Complaint  Patient presents with   Numbness    Noah Clayton is a 46 y.o. 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  He also reports some tingling of the right foot.  Patient has not had any medication for peripheral neuropathy in the past.  Patient does not know what his sugars have been running, reports that despite taking his diabetes medication as prescribed he does not check his sugar ever, last time was over a year ago.  Patient reports that he is establishing care with a new primary care doctor in the morning.  HPI     Home Medications Prior to Admission medications   Medication Sig Start Date End Date Taking? Authorizing Provider  acetaminophen (TYLENOL) 500 MG tablet Take 2 tablets (1,000 mg total) by mouth every 6 (six) hours as needed for mild pain (or Fever >/= 101). 02/19/20   Barnetta Chapel, PA-C  atorvastatin (LIPITOR) 10 MG tablet Take 10 mg by mouth daily.    [provider]  glucose blood test strip Use as instructed 06/22/18   Henderly, Britni A, PA-C  insulin glargine (LANTUS) 100 unit/mL SOPN Inject 40 Units into the skin daily.    [provider]  insulin lispro (HUMALOG) 100 UNIT/ML KwikPen INJECT 5 UNITS INTO THE SKIN 3 (THREE) TIMES DAILY BEFORE MEALS. 02/19/20 02/18/21  Danford, Earl Lites, MD  Insulin Pen Needle (PEN NEEDLES) 32G X 6 MM MISC 1 each by Does not apply route 3 (three) times daily before meals. 02/19/20   Danford, Earl Lites, MD  mesalamine (CANASA) 1000 MG suppository Place 1,000 mg rectally at bedtime. 01/28/20   [provider]  metFORMIN (GLUCOPHAGE-XR) 750 MG 24 hr tablet Take 750 mg by mouth  daily with breakfast.    [provider]  predniSONE (DELTASONE) 10 MG tablet Take 10-40 mg by mouth See admin instructions. Filled 01/28/2020: take 4 tablets (40 mg) by mouth daily for one week, then take 3 tablets (30 mg) daily for one week, then take 2 tablets (20 mg) daily for one week, then take 1 tablet (10 mg) daily for one week, then stop 01/28/20   [provider]  ramipril (ALTACE) 10 MG capsule Take 10 mg by mouth daily.    [provider]  RIVAROXABAN Carlena Hurl) VTE STARTER PACK (15 & 20 MG TABLETS) Follow package directions: Take one 15mg  tablet by mouth twice a day. On day 22, switch to one 20mg  tablet once a day. Take with food. Patient taking differently: Take 15-20 mg by mouth See admin instructions. Follow package directions: Take one 15mg  tablet by mouth twice a day. On day 22, switch to one 20mg  tablet once a day. Take with food. 12/19/19   Wallis Bamberg, PA-C  albuterol (PROVENTIL HFA;VENTOLIN HFA) 108 (90 Base) MCG/ACT inhaler Inhale 2 puffs into the lungs every 4 (four) hours as needed for wheezing or shortness of breath. 03/24/16 03/14/19  Charm Rings, MD      Allergies    Patient has no known allergies.    Review of Systems   Review of Systems  Neurological:  Positive for numbness.  All other systems reviewed and are negative.  Physical Exam Updated  Vital Signs BP (!) 142/92 (BP Location: Left Arm)    Pulse 92    Temp 98.2 F (36.8 C) (Oral)    Resp 18    Ht 5\' 11"  (1.803 m)    Wt 118.8 kg    SpO2 98%    BMI 36.54 kg/m  Physical Exam Vitals and nursing note reviewed.  Constitutional:      General: He is not in acute distress.    Appearance: Normal appearance.  HENT:     Head: Normocephalic and atraumatic.  Eyes:     General:        Right eye: No discharge.        Left eye: No discharge.  Cardiovascular:     Rate and Rhythm: Normal rate and regular rhythm.     Pulses: Normal pulses.     Comments: Intact DP, PT pulses  bilaterally Pulmonary:     Effort: Pulmonary effort is normal. No respiratory distress.  Musculoskeletal:        General: No deformity.  Skin:    General: Skin is warm and dry.  Neurological:     Mental Status: He is alert and oriented to person, place, and time.  Psychiatric:        Mood and Affect: Mood normal.        Behavior: Behavior normal.    ED Results / Procedures / Treatments   Labs (all labs ordered are listed, but only abnormal results are displayed) Labs Reviewed  BASIC METABOLIC PANEL - Abnormal; Notable for the following components:      Result Value   Sodium 133 (*)    Glucose, Bld 268 (*)    All other components within normal limits  CBC WITH DIFFERENTIAL/PLATELET  VITAMIN B12    EKG None  Radiology No results found.  Procedures Procedures    Medications Ordered in ED Medications - No data to display  ED Course/ Medical Decision Making/ A&P                           Medical Decision Making Amount and/or Complexity of Data Reviewed Labs: ordered.   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  Patient reports that the symptoms been ongoing for several months, but worsening over the last month.  Patient reports occasional pain with the tingling.  The emergent differential diagnosis includes acute neuropathy, new DVT, peripheral neuropathy changes secondary to diabetes, acute peripheral arterial disease, ischemic limb, this is not an exhaustive differential.  Based on my physical examination I have no clinical concern for peripheral arterial disease in this patient he has intact PT, DP pulses bilaterally.  He has no evidence of pallor, or swelling of the lower extremity.  He has been chronically on Xarelto, and is compliant with his medication.  He has no significant swelling of  bilateral calves, I have minimal clinical concern for acute DVT at this time.  Patient additionally has no history of injury to the back, no weakness of the left lower extremity, no decreased range of motion or strength of left lower extremity, his distribution of numbness is not consistent with a lumbar radiculopathy, he has no evidence of saddle anesthesia or other red flags of back pain.  I personally ordered and reviewed lab work including CBC, BMP, B12 which is significant for  a BMP with pseudohyponatremia, should be just normal sodium value when accounting for hyperglycemia glucose of 268.  Discussed with patient that since he does not take his blood sugar readings ever, over the last year if he has been persistently with a glucose of 2 60-300 this is likely to contribute to escalating peripheral neuropathy.  With normocytic blood, no evidence of anemia minimal clinical concern for B12, folate deficiency, but discussed that patient should check his B12 results, follow-up with folate testing with his PCP when he follows up in the morning.  Encourage patient to continue his diabetes medication, check his blood sugar daily, and with meals as he is supposed to, and discussed with primary care about beginning a medication such as gabapentin for peripheral neuropathy pain.  Patient discharged in stable condition at this time, return precautions given. Final Clinical Impression(s) / ED Diagnoses Final diagnoses:  Polyneuropathy associated with underlying disease [facility])    Rx / DC Orders ED Discharge Orders     None         Olene Floss, PA-C 04/28/21 2357    Terrilee Files, MD 04/29/21 [phone]

## 2021-04-28 NOTE — Discharge Instructions (Addendum)
As we discussed your blood sugar was elevated today, I recommend that you continue take your diabetes medication, blood thinners, I do recommend that you start checking your blood sugar at least 1-2 times daily.  Considering your history of diabetes, elevated blood sugar in the office today, I believe that the numbness that you have been experiencing is likely related to peripheral neuropathy.  Please follow-up with your primary care provider tomorrow for further evaluation of the symptoms.

## 2021-04-29 LAB — VITAMIN B12: Vitamin B-12: 363 pg/mL (ref 180–914)

## 2021-06-17 ENCOUNTER — Encounter (HOSPITAL_BASED_OUTPATIENT_CLINIC_OR_DEPARTMENT_OTHER): Payer: Self-pay

## 2021-06-17 ENCOUNTER — Other Ambulatory Visit: Payer: Self-pay

## 2021-06-17 ENCOUNTER — Emergency Department (HOSPITAL_BASED_OUTPATIENT_CLINIC_OR_DEPARTMENT_OTHER): Payer: BC Managed Care – PPO

## 2021-06-17 ENCOUNTER — Emergency Department (HOSPITAL_BASED_OUTPATIENT_CLINIC_OR_DEPARTMENT_OTHER)
Admission: EM | Admit: 2021-06-17 | Discharge: 2021-06-17 | Disposition: A | Discharge status: Home / Self Care | Payer: BC Managed Care – PPO | Attending: Emergency Medicine | Admitting: Emergency Medicine

## 2021-06-17 DIAGNOSIS — Z7901 Long term (current) use of anticoagulants: Secondary | ICD-10-CM | POA: Insufficient documentation

## 2021-06-17 DIAGNOSIS — I10 Essential (primary) hypertension: Secondary | ICD-10-CM | POA: Diagnosis not present

## 2021-06-17 DIAGNOSIS — Z7984 Long term (current) use of oral hypoglycemic drugs: Secondary | ICD-10-CM | POA: Insufficient documentation

## 2021-06-17 DIAGNOSIS — Z794 Long term (current) use of insulin: Secondary | ICD-10-CM | POA: Insufficient documentation

## 2021-06-17 DIAGNOSIS — E119 Type 2 diabetes mellitus without complications: Secondary | ICD-10-CM | POA: Insufficient documentation

## 2021-06-17 DIAGNOSIS — M7989 Other specified soft tissue disorders: Secondary | ICD-10-CM | POA: Insufficient documentation

## 2021-06-17 DIAGNOSIS — L03116 Cellulitis of left lower limb: Secondary | ICD-10-CM | POA: Diagnosis not present

## 2021-06-17 DIAGNOSIS — Z79899 Other long term (current) drug therapy: Secondary | ICD-10-CM | POA: Diagnosis not present

## 2021-06-17 DIAGNOSIS — S90422A Blister (nonthermal), left great toe, initial encounter: Secondary | ICD-10-CM | POA: Insufficient documentation

## 2021-06-17 DIAGNOSIS — S90932A Unspecified superficial injury of left great toe, initial encounter: Secondary | ICD-10-CM | POA: Diagnosis present

## 2021-06-17 DIAGNOSIS — X58XXXA Exposure to other specified factors, initial encounter: Secondary | ICD-10-CM | POA: Insufficient documentation

## 2021-06-17 LAB — CBC WITH DIFFERENTIAL/PLATELET
Abs Immature Granulocytes: 0.02 10*3/uL (ref 0.00–0.07)
Basophils Absolute: 0 10*3/uL (ref 0.0–0.1)
Basophils Relative: 0 %
Eosinophils Absolute: 0.1 10*3/uL (ref 0.0–0.5)
Eosinophils Relative: 1 %
HCT: 43.7 % (ref 39.0–52.0)
Hemoglobin: 14.5 g/dL (ref 13.0–17.0)
Immature Granulocytes: 0 %
Lymphocytes Relative: 26 %
Lymphs Abs: 2.6 10*3/uL (ref 0.7–4.0)
MCH: 29.1 pg (ref 26.0–34.0)
MCHC: 33.2 g/dL (ref 30.0–36.0)
MCV: 87.6 fL (ref 80.0–100.0)
Monocytes Absolute: 1.1 10*3/uL — ABNORMAL HIGH (ref 0.1–1.0)
Monocytes Relative: 11 %
Neutro Abs: 6 10*3/uL (ref 1.7–7.7)
Neutrophils Relative %: 62 %
Platelets: 283 10*3/uL (ref 150–400)
RBC: 4.99 MIL/uL (ref 4.22–5.81)
RDW: 13.2 % (ref 11.5–15.5)
WBC: 9.7 10*3/uL (ref 4.0–10.5)
nRBC: 0 % (ref 0.0–0.2)

## 2021-06-17 LAB — COMPREHENSIVE METABOLIC PANEL
ALT: 17 U/L (ref 0–44)
AST: 18 U/L (ref 15–41)
Albumin: 3.5 g/dL (ref 3.5–5.0)
Alkaline Phosphatase: 128 U/L — ABNORMAL HIGH (ref 38–126)
Anion gap: 6 (ref 5–15)
BUN: 14 mg/dL (ref 6–20)
CO2: 26 mmol/L (ref 22–32)
Calcium: 9.1 mg/dL (ref 8.9–10.3)
Chloride: 102 mmol/L (ref 98–111)
Creatinine, Ser: 0.82 mg/dL (ref 0.61–1.24)
GFR, Estimated: >60 mL/min (ref >60)
Glucose, Bld: 177 mg/dL — ABNORMAL HIGH (ref 70–99)
Potassium: 3.9 mmol/L (ref 3.5–5.1)
Sodium: 134 mmol/L — ABNORMAL LOW (ref 135–145)
Total Bilirubin: 0.7 mg/dL (ref 0.3–1.2)
Total Protein: 7.5 g/dL (ref 6.5–8.1)

## 2021-06-17 LAB — LACTIC ACID, PLASMA: Lactic Acid, Venous: 1.5 mmol/L (ref 0.5–1.9)

## 2021-06-17 LAB — CBG MONITORING, ED: Glucose-Capillary: 117 mg/dL — ABNORMAL HIGH (ref 70–99)

## 2021-06-17 MED ORDER — PIPERACILLIN-TAZOBACTAM 3.375 G IVPB 30 MIN
3.3750 g | Freq: Once | INTRAVENOUS | Status: AC
  Administered 2021-06-17: 3.375 g via INTRAVENOUS
  Filled 2021-06-17: qty 50

## 2021-06-17 MED ORDER — CEPHALEXIN 500 MG PO CAPS: 500.0000 mg | ORAL_CAPSULE | Freq: Three times a day (TID) | ORAL | 0 refills | Status: AC

## 2021-06-17 MED ORDER — SODIUM CHLORIDE 0.9 % IV SOLN: INTRAVENOUS | Status: DC

## 2021-06-17 NOTE — ED Triage Notes (Signed)
Pt c/o blister to left great toe-now redness/swelling to foot-sent from PCP office-NAD-to triage in w/c ?

## 2021-06-17 NOTE — ED Provider Notes (Signed)
?[facility] EMERGENCY DEPARTMENT ?Provider Note ? ? ?CSN: [id number] ?Arrival date & time: 06/17/21  1247 ? ?  ? ?History ? ?Chief Complaint  ?Patient presents with  ? Blister  ? ? ?Noah Clayton is a 46 y.o. male. ? ?Patient sent over from [facility].  Patient sent for concerns for cellulitis.  Patient has a blood blister on top of his left toe behind the nail.  And has erythema to the foot and says goes about halfway up the leg.  Redness and increased warmth.  Patient states that he is got neuropathy.  Thinks that his shoes rubbed the top of his toe to create the blood blister.  Patient states been going on for a few days.  Has been soaking his right foot in warm water and Epsom salts. ? 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  Patient is on insulin for the diabetes and also on metformin.  Patient was on Xarelto in the past.  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  Denies any history of pulmonary embolus. ? ? ?  ? ?Home Medications ?Prior to Admission medications   ?Medication Sig Start Date End Date Taking? Authorizing Provider  ?acetaminophen (TYLENOL) 500 MG tablet Take 2 tablets (1,000 mg total) by mouth every 6 (six) hours as needed for mild pain (or Fever >/= 101). 02/19/20   Barnetta Chapel, PA-C  ?atorvastatin (LIPITOR) 10 MG tablet Take 10 mg by mouth daily.    [provider]  ?glucose blood test strip Use as instructed 06/22/18   Henderly, Britni A, PA-C  ?insulin glargine (LANTUS) 100 unit/mL SOPN Inject 40 Units into the skin daily.    [provider]  ?insulin lispro (HUMALOG) 100 UNIT/ML KwikPen INJECT 5 UNITS INTO THE SKIN 3 (THREE) TIMES DAILY BEFORE MEALS. 02/19/20 02/18/21  Danford, Earl Lites, MD  ?Insulin Pen Needle (PEN NEEDLES) 32G X 6 MM MISC 1 each by Does not apply route  3 (three) times daily before meals. 02/19/20   Danford, Earl Lites, MD  ?mesalamine (CANASA) 1000 MG suppository Place 1,000 mg rectally at bedtime. 01/28/20   [provider]  ?metFORMIN (GLUCOPHAGE-XR) 750 MG 24 hr tablet Take 750 mg by mouth daily with breakfast.    [provider]  ?predniSONE (DELTASONE) 10 MG tablet Take 10-40 mg by mouth See admin instructions. Filled 01/28/2020: take 4 tablets (40 mg) by mouth daily for one week, then take 3 tablets (30 mg) daily for one week, then take 2 tablets (20 mg) daily for one week, then take 1 tablet (10 mg) daily for one week, then stop 01/28/20   [provider]  ?ramipril (ALTACE) 10 MG capsule Take 10 mg by mouth daily.    [provider]  ?RIVAROXABAN Carlena Hurl) VTE STARTER PACK (15 & 20 MG TABLETS) Follow package directions: Take one 15mg  tablet by mouth twice a day. On day 22, switch to one 20mg  tablet once a day. Take with food. ?Patient taking differently: Take 15-20 mg by mouth See admin instructions. Follow package directions: Take one 15mg  tablet by mouth twice a day. On day 22, switch to one 20mg  tablet once a day. Take with food. 12/19/19   , PA-C  ?albuterol (PROVENTIL HFA;VENTOLIN HFA) 108 (90 Base) MCG/ACT inhaler Inhale 2 puffs into the  lungs every 4 (four) hours as needed for wheezing or shortness of breath. 03/24/16 03/14/19  Charm Rings, MD  ?   ? ?Allergies    ?Patient has no known allergies.   ? ?Review of Systems   ?Review of Systems  ?Constitutional:  Negative for chills and fever.  ?HENT:  Negative for ear pain and sore throat.   ?Eyes:  Negative for pain and visual disturbance.  ?Respiratory:  Negative for cough and shortness of breath.   ?Cardiovascular:  Positive for leg swelling. Negative for chest pain and palpitations.  ?Gastrointestinal:  Negative for abdominal pain and vomiting.  ?Genitourinary:  Negative for dysuria and hematuria.  ?Musculoskeletal:  Negative for arthralgias and back  pain.  ?Skin:  Negative for color change and rash.  ?Neurological:  Positive for numbness. Negative for seizures and syncope.  ?All other systems reviewed and are negative. ? ?Physical Exam ?Updated Vital Signs ?BP 131/86   Pulse 83   Temp 98.2 ?F (36.8 ?C) (Oral)   Resp 16   Ht 1.803 m (5\' 11" )   Wt 120.7 kg   SpO2 100%   BMI 37.10 kg/m?  ?Physical Exam ?Vitals and nursing note reviewed.  ?Constitutional:   ?   General: He is not in acute distress. ?   Appearance: Normal appearance. He is well-developed.  ?HENT:  ?   Head: Normocephalic and atraumatic.  ?Eyes:  ?   Extraocular Movements: Extraocular movements intact.  ?   Conjunctiva/sclera: Conjunctivae normal.  ?   Pupils: Pupils are equal, round, and reactive to light.  ?Cardiovascular:  ?   Rate and Rhythm: Normal rate and regular rhythm.  ?   Heart sounds: No murmur heard. ?Pulmonary:  ?   Effort: Pulmonary effort is normal. No respiratory distress.  ?   Breath sounds: Normal breath sounds.  ?Abdominal:  ?   Palpations: Abdomen is soft.  ?   Tenderness: There is no abdominal tenderness.  ?Musculoskeletal:     ?   General: Swelling present.  ?   Cervical back: Normal range of motion and neck supple.  ?   Right lower leg: No edema.  ?   Left lower leg: Edema present.  ?   Comments: Blood blister on top of the left great toe.  No signs of any purulence.  That blood blister is currently intact.  Most likely from a rubbing abrasion.  Erythema to include the foot ankle and at least a third up the left leg.  Dorsalis pedis pulses 1+ is increased warmth.  Good cap refill to toes.  Right lower extremity no erythema dorsalis pedis pulses 2+.  Also patient's left calf has some swelling as well.  No swelling to the right lower extremity.  ?Skin: ?   General: Skin is warm and dry.  ?   Capillary Refill: Capillary refill takes less than 2 seconds.  ?Neurological:  ?   General: No focal deficit present.  ?   Mental Status: He is alert and oriented to person, place,  and time.  ?   Cranial Nerves: No cranial nerve deficit.  ?   Sensory: No sensory deficit.  ?   Motor: No weakness.  ?Psychiatric:     ?   Mood and Affect: Mood normal.  ? ? ?ED Results / Procedures / Treatments   ?Labs ?(all labs ordered are listed, but only abnormal results are displayed) ?Labs Reviewed  ?CBC WITH DIFFERENTIAL/PLATELET - Abnormal; Notable for the following components:  ?  Result Value  ? Monocytes Absolute 1.1 (*)   ? All other components within normal limits  ?CULTURE, BLOOD (ROUTINE X 2)  ?CULTURE, BLOOD (ROUTINE X 2)  ?COMPREHENSIVE METABOLIC PANEL  ?LACTIC ACID, PLASMA  ?CBG MONITORING, ED  ? ? ?EKG ?None ? ?Radiology ?DG Foot Complete Left ? ?Result Date: 06/17/2021 ?CLINICAL DATA:  Cellulitis of the great toe. EXAM: LEFT FOOT - COMPLETE 3+ VIEW COMPARISON:  None. FINDINGS: There is no evidence of fracture or dislocation. There is no cortical erosion or periosteal reaction. There is no evidence of arthropathy or other focal bone abnormality. Vascular calcifications. IMPRESSION: No acute osseous abnormality. Electronically Signed   By: Larose HiresImran  Ahmed D.O.   On: 06/17/2021 15:10   ? ?Procedures ?Procedures  ? ? ?Medications Ordered in ED ?Medications  ?0.9 %  sodium chloride infusion ( Intravenous New Bag/Given 06/17/21 1508)  ?piperacillin-tazobactam (ZOSYN) IVPB 3.375 g (3.375 g Intravenous New Bag/Given 06/17/21 1508)  ? ? ?ED Course/ Medical Decision Making/ A&P ?  ?                        ?Medical Decision Making ?Amount and/or Complexity of Data Reviewed ?Labs: ordered. ?Radiology: ordered. ? ?Risk ?Prescription drug management. ? ?Clinically not concerned for vascular compromise.  Arterial blood flow seems to be good.  Patient definitely has a cellulitis.  Most likely started from the blood blister on top of the big toe.  No signs of any purulence or pus in that area.  Nontender to palpation.  Excellent cap refill dorsalis pedis pulses of the left foot is 1+. ? ?Patient is a diabetic.  We  will treat for cellulitis diabetic not concerned about MRSA at this point in time so we will just give Zosyn. ? ?Patient will get x-ray of the left foot.  And will get DVT study of the left lower extremity to rul

## 2021-06-17 NOTE — ED Provider Notes (Signed)
?  Physical Exam  ?BP 132/81   Pulse 87   Temp 98.2 ?F (36.8 ?C) (Oral)   Resp 17   Ht 5\' 11"  (1.803 m)   Wt 120.7 kg   SpO2 100%   BMI 37.10 kg/m?  ? ?Physical Exam ? ?Procedures  ?Procedures ? ?ED Course / MDM  ?  ?Medical Decision Making ?Care assumed at 3 pm. Patient has L big toe blister and patient is diabetic. Sign out pending labs and xrays and DVT study  ? ?6:02 PM ?WBC nl. Xray showed no osteo. DVT study negative. Given zosyn. Blood cultures sent. Offered admission vs close follow up. Patient has follow up with vascular doctor in 2 days. Will refer to podiatry. Will dc home with keflex  ? ?Problems Addressed: ?Cellulitis of left lower extremity: acute illness or injury ? ?Amount and/or Complexity of Data Reviewed ?Labs: ordered. Decision-making details documented in ED Course. ?Radiology: ordered and independent interpretation performed. Decision-making details documented in ED Course. ? ?Risk ?Prescription drug management. ? ? ? ? ? ? ? ?  ?Drenda Freeze, MD ?06/17/21 1804 ? ?

## 2021-06-17 NOTE — Discharge Instructions (Addendum)
Take keflex three times daily for a week  ? ?See podiatry or vascular surgery for follow up  ? ?Return to ER if you have worse foot swelling or redness, fever, purulent drainage  ?

## 2021-06-22 LAB — CULTURE, BLOOD (ROUTINE X 2)
Culture: NO GROWTH
Culture: NO GROWTH
Special Requests: ADEQUATE
Special Requests: ADEQUATE

## 2022-01-30 ENCOUNTER — Ambulatory Visit (HOSPITAL_COMMUNITY)
Admission: EM | Admit: 2022-01-30 | Discharge: 2022-01-30 | Disposition: A | Discharge status: Home / Self Care | Payer: BC Managed Care – PPO | Attending: Emergency Medicine | Admitting: Emergency Medicine

## 2022-01-30 ENCOUNTER — Encounter (HOSPITAL_COMMUNITY): Payer: Self-pay | Admitting: Emergency Medicine

## 2022-01-30 DIAGNOSIS — J069 Acute upper respiratory infection, unspecified: Secondary | ICD-10-CM

## 2022-01-30 MED ORDER — PROMETHAZINE-DM 6.25-15 MG/5ML PO SYRP: 5.0000 mL | ORAL_SOLUTION | Freq: Four times a day (QID) | ORAL | 0 refills | Status: AC | PRN

## 2022-01-30 MED ORDER — BENZONATATE 100 MG PO CAPS: 100.0000 mg | ORAL_CAPSULE | Freq: Three times a day (TID) | ORAL | 0 refills | Status: AC

## 2022-01-30 MED ORDER — AZITHROMYCIN 250 MG PO TABS: 250.0000 mg | ORAL_TABLET | Freq: Every day | ORAL | 0 refills | Status: AC

## 2022-01-30 NOTE — Discharge Instructions (Signed)
On your exam there is congestion within your nose and your throat is red without Janilah Hojnacki patches or tonsillar swelling, your lungs and heart sound normal  Begin azithromycin as directed to provide coverage for bacteria which may be aiding to your symptoms  You may use Tessalon pill every 8 hours to help calm your coughing  You may use cough syrup every 6 hours as needed for additional comfort, be mindful this medication may make you feel drowsy    You can take Tylenol and/or Ibuprofen as needed for fever reduction and pain relief.   For cough: honey 1/2 to 1 teaspoon (you can dilute the honey in water or another fluid).  You can also use guaifenesin and dextromethorphan for cough. You can use a humidifier for chest congestion and cough.  If you don't have a humidifier, you can sit in the bathroom with the hot shower running.      For sore throat: try warm salt water gargles, cepacol lozenges, throat spray, warm tea or water with lemon/honey, popsicles or ice, or OTC cold relief medicine for throat discomfort.   For congestion: take a daily anti-histamine like Zyrtec, Claritin, and a oral decongestant, such as pseudoephedrine.  You can also use Flonase 1-2 sprays in each nostril daily.   It is important to stay hydrated: drink plenty of fluids (water, gatorade/powerade/pedialyte, juices, or teas) to keep your throat moisturized and help further relieve irritation/discomfort.

## 2022-01-30 NOTE — ED Triage Notes (Signed)
Pt reports nasal congestion and a cough. States symptoms began last week with a dry mouth and lost of taste. Home covid test was neg. States feels like he has a head cold that is not going away. Symptoms worsen when laying down.

## 2022-01-30 NOTE — ED Provider Notes (Signed)
[facility]    CSN: [id number] Arrival date & time: 01/30/22  1521      History   Chief Complaint Chief Complaint  Patient presents with   Cough   Nasal Congestion    HPI Noah Clayton is a 46 y.o. male.   Patient presents with subjective fevers, nasal congestion, rhinorrhea, diminished taste and a nonproductive cough for 7 days.  Endorses symptoms are worsening prior dominantly at nighttime.  Taste has begun to return and is approximately 50% at baseline.  No known sick contacts.  Tolerating food and liquids.  Has attempted use of Mucinex, TheraFlu cold and flu, Alka-Seltzer cold and flu with minimal relief.  History of diabetes, DVT, hypertension.  Denies shortness of breath or wheezing.  Home COVID test negative  Past Medical History:  Diagnosis Date   COVID-19 02/2019   Diabetes mellitus without complication (HCC)    DVT (deep venous thrombosis) (HCC)    both legs   Hypertension    Kidney stone     Patient Active Problem List   Diagnosis Date Noted   Sepsis due to cellulitis (HCC) 02/16/2020   Lactic acidosis 02/16/2020   Cellulitis of left lower extremity 02/16/2020   Acute deep vein thrombosis (DVT) of left peroneal vein (HCC) 02/16/2020   Uncontrolled type 2 diabetes mellitus with hyperglycemia, with long-term current use of insulin (HCC) 02/16/2020   Essential hypertension 02/16/2020   Inflammatory bowel disease 02/16/2020    Past Surgical History:  Procedure Laterality Date   INCISION AND DRAINAGE ABSCESS Left 02/17/2020   Procedure: INCISION AND DRAINAGE, GROIN;  Surgeon: Violeta Gelinas, MD;  Location: [facility];  Service: General;  Laterality: Left;       Home Medications    Prior to Admission medications   Medication Sig Start Date End Date Taking? Authorizing Provider  acetaminophen (TYLENOL) 500 MG tablet Take 2 tablets (1,000 mg total) by mouth every 6 (six) hours as needed for mild pain (or Fever >/= 101). 02/19/20   Barnetta Chapel,  PA-C  atorvastatin (LIPITOR) 10 MG tablet Take 10 mg by mouth daily.    [provider]  cephALEXin (KEFLEX) 500 MG capsule Take 1 capsule (500 mg total) by mouth 3 (three) times daily. 06/17/21   Charlynne Pander, MD  glucose blood test strip Use as instructed 06/22/18   Henderly, Britni A, PA-C  insulin glargine (LANTUS) 100 unit/mL SOPN Inject 40 Units into the skin daily.    [provider]  insulin lispro (HUMALOG) 100 UNIT/ML KwikPen INJECT 5 UNITS INTO THE SKIN 3 (THREE) TIMES DAILY BEFORE MEALS. 02/19/20 02/18/21  Danford, Earl Lites, MD  Insulin Pen Needle (PEN NEEDLES) 32G X 6 MM MISC 1 each by Does not apply route 3 (three) times daily before meals. 02/19/20   Danford, Earl Lites, MD  mesalamine (CANASA) 1000 MG suppository Place 1,000 mg rectally at bedtime. 01/28/20   [provider]  metFORMIN (GLUCOPHAGE-XR) 750 MG 24 hr tablet Take 750 mg by mouth daily with breakfast.    [provider]  predniSONE (DELTASONE) 10 MG tablet Take 10-40 mg by mouth See admin instructions. Filled 01/28/2020: take 4 tablets (40 mg) by mouth daily for one week, then take 3 tablets (30 mg) daily for one week, then take 2 tablets (20 mg) daily for one week, then take 1 tablet (10 mg) daily for one week, then stop 01/28/20   [provider]  ramipril (ALTACE) 10 MG capsule Take 10 mg by mouth  daily.    [provider]  RIVAROXABAN Carlena Hurl) VTE STARTER PACK (15 & 20 MG TABLETS) Follow package directions: Take one 15mg  tablet by mouth twice a day. On day 22, switch to one 20mg  tablet once a day. Take with food. Patient taking differently: Take 15-20 mg by mouth See admin instructions. Follow package directions: Take one 15mg  tablet by mouth twice a day. On day 22, switch to one 20mg  tablet once a day. Take with food. 12/19/19   , PA-C  albuterol (PROVENTIL HFA;VENTOLIN HFA) 108 (90 Base) MCG/ACT inhaler Inhale 2 puffs into the lungs every 4 (four)  hours as needed for wheezing or shortness of breath. 03/24/16 03/14/19  12/21/19, MD    Family History Family History  Problem Relation Age of Onset   Diabetes Mother    Diabetes Father     Social History Social History   Tobacco Use   Smoking status: Former   Smokeless tobacco: Never  Wallis Bamberg Use: Never used  Substance Use Topics   Alcohol use: No   Drug use: No     Allergies   Patient has no known allergies.   Review of Systems Review of Systems  Constitutional:  Positive for fever. Negative for activity change, appetite change, chills, diaphoresis, fatigue and unexpected weight change.  HENT:  Positive for congestion and rhinorrhea. Negative for dental problem, drooling, ear discharge, ear pain, facial swelling, hearing loss, mouth sores, nosebleeds, postnasal drip, sinus pressure, sinus pain, sneezing, sore throat, tinnitus, trouble swallowing and voice change.   Respiratory:  Positive for cough. Negative for apnea, choking, chest tightness, shortness of breath, wheezing and stridor.   Cardiovascular: Negative.   Gastrointestinal: Negative.   Skin: Negative.      Physical Exam Triage Vital Signs ED Triage Vitals  Enc Vitals Group     BP 01/30/22 1609 (!) 157/96     Pulse Rate 01/30/22 1609 89     Resp 01/30/22 1609 18     Temp 01/30/22 1609 98.1 F (36.7 C)     Temp Source 01/30/22 1609 Oral     SpO2 01/30/22 1609 97 %     Weight --      Height --      Head Circumference --      Peak Flow --      Pain Score 01/30/22 1607 0     Pain Loc --      Pain Edu? --      Excl. in GC? --    No data found.  Updated Vital Signs BP (!) 157/96 (BP Location: Right Arm)   Pulse 89   Temp 98.1 F (36.7 C) (Oral)   Resp 18   SpO2 97%   Visual Acuity Right Eye Distance:   Left Eye Distance:   Bilateral Distance:    Right Eye Near:   Left Eye Near:    Bilateral Near:     Physical Exam Constitutional:      Appearance: Normal appearance.   HENT:     Head: Normocephalic.     Right Ear: Tympanic membrane, ear canal and external ear normal.     Left Ear: Tympanic membrane, ear canal and external ear normal.     Nose: Congestion and rhinorrhea present.     Mouth/Throat:     Mouth: Mucous membranes are moist.     Pharynx: Posterior oropharyngeal erythema present.     Tonsils: No tonsillar exudate. 0 on the right. 0  on the left.  Eyes:     Extraocular Movements: Extraocular movements intact.  Cardiovascular:     Rate and Rhythm: Normal rate and regular rhythm.     Pulses: Normal pulses.     Heart sounds: Normal heart sounds.  Pulmonary:     Effort: Pulmonary effort is normal.     Breath sounds: Normal breath sounds.  Skin:    General: Skin is warm and dry.  Neurological:     Mental Status: He is alert and oriented to person, place, and time. Mental status is at baseline.  Psychiatric:        Mood and Affect: Mood normal.        Behavior: Behavior normal.      UC Treatments / Results  Labs (all labs ordered are listed, but only abnormal results are displayed) Labs Reviewed - No data to display  EKG   Radiology No results found.  Procedures Procedures (including critical care time)  Medications Ordered in UC Medications - No data to display  Initial Impression / Assessment and Plan / UC Course  I have reviewed the triage vital signs and the nursing notes.  Pertinent labs & imaging results that were available during my care of the patient were reviewed by me and considered in my medical decision making (see chart for details).  Acute upper respiratory infection  Patient is in no signs of distress nor toxic appearing.  Vital signs are stable.  Low suspicion for pneumonia, pneumothorax or bronchitis and therefore will defer imaging.  Symptoms are progressing past 7 days without signs of resolution we will provide coverage, Z-Pak, Tessalon and promethazine prescribed as cough is most worrisome symptom today.  May use additional over-the-counter medications as needed for supportive care.  May follow-up with urgent care as needed if symptoms persist or worsen.   Final Clinical Impressions(s) / UC Diagnoses   Final diagnoses:  None   Discharge Instructions   None    ED Prescriptions   None    PDMP not reviewed this encounter.   Valinda Hoar, NP 01/30/22 1622
